# Patient Record
Sex: Male | Born: 1960 | Race: White | Hispanic: No | Marital: Married | Smoking: Current some day smoker
Health system: Southern US, Community
[De-identification: ages and names within clinical notes are randomized; demographics above are authoritative.]

## PROBLEM LIST (undated history)

## (undated) DIAGNOSIS — M1990 Unspecified osteoarthritis, unspecified site: Secondary | ICD-10-CM

## (undated) DIAGNOSIS — K219 Gastro-esophageal reflux disease without esophagitis: Secondary | ICD-10-CM

## (undated) DIAGNOSIS — I10 Essential (primary) hypertension: Secondary | ICD-10-CM

## (undated) DIAGNOSIS — I4891 Unspecified atrial fibrillation: Secondary | ICD-10-CM

## (undated) DIAGNOSIS — E119 Type 2 diabetes mellitus without complications: Secondary | ICD-10-CM

## (undated) DIAGNOSIS — E785 Hyperlipidemia, unspecified: Secondary | ICD-10-CM

## (undated) HISTORY — DX: Hyperlipidemia, unspecified: E78.5

## (undated) HISTORY — DX: Unspecified osteoarthritis, unspecified site: M19.90

## (undated) HISTORY — DX: Unspecified atrial fibrillation: I48.91

## (undated) HISTORY — PX: OTHER SURGICAL HISTORY: SHX169

## (undated) HISTORY — PX: VASECTOMY: SHX75

## (undated) HISTORY — DX: Type 2 diabetes mellitus without complications: E11.9

## (undated) HISTORY — DX: Essential (primary) hypertension: I10

## (undated) HISTORY — PX: COLONOSCOPY: SHX174

## (undated) HISTORY — DX: Gastro-esophageal reflux disease without esophagitis: K21.9

---

## 2007-12-26 ENCOUNTER — Ambulatory Visit: Payer: Self-pay | Admitting: Cardiology

## 2007-12-30 ENCOUNTER — Ambulatory Visit: Payer: Self-pay

## 2007-12-30 ENCOUNTER — Encounter: Payer: Self-pay | Admitting: Cardiology

## 2007-12-30 ENCOUNTER — Ambulatory Visit: Payer: Self-pay | Admitting: Cardiology

## 2008-01-12 ENCOUNTER — Encounter: Admission: RE | Admit: 2008-01-12 | Discharge: 2008-02-14 | Payer: Self-pay | Admitting: Cardiology

## 2008-01-16 ENCOUNTER — Ambulatory Visit: Payer: Self-pay | Admitting: Cardiology

## 2008-02-03 ENCOUNTER — Ambulatory Visit: Payer: Self-pay | Admitting: Internal Medicine

## 2008-07-31 ENCOUNTER — Encounter: Admission: RE | Admit: 2008-07-31 | Discharge: 2008-07-31 | Payer: Self-pay | Admitting: Family Medicine

## 2008-12-21 ENCOUNTER — Telehealth: Payer: Self-pay | Admitting: Cardiology

## 2008-12-25 DIAGNOSIS — I493 Ventricular premature depolarization: Secondary | ICD-10-CM | POA: Insufficient documentation

## 2009-01-02 ENCOUNTER — Ambulatory Visit: Payer: Self-pay | Admitting: Cardiology

## 2009-01-17 ENCOUNTER — Ambulatory Visit: Payer: Self-pay | Admitting: Cardiology

## 2009-01-17 DIAGNOSIS — I959 Hypotension, unspecified: Secondary | ICD-10-CM | POA: Insufficient documentation

## 2009-01-22 ENCOUNTER — Ambulatory Visit: Payer: Self-pay | Admitting: Cardiology

## 2009-01-22 DIAGNOSIS — E785 Hyperlipidemia, unspecified: Secondary | ICD-10-CM

## 2009-01-22 DIAGNOSIS — E1169 Type 2 diabetes mellitus with other specified complication: Secondary | ICD-10-CM | POA: Insufficient documentation

## 2009-01-22 DIAGNOSIS — E7800 Pure hypercholesterolemia, unspecified: Secondary | ICD-10-CM | POA: Insufficient documentation

## 2009-01-22 DIAGNOSIS — I10 Essential (primary) hypertension: Secondary | ICD-10-CM | POA: Insufficient documentation

## 2009-01-22 LAB — CONVERTED CEMR LAB
ALT: 23 units/L (ref 0–53)
AST: 19 units/L (ref 0–37)
Albumin: 4.3 g/dL (ref 3.5–5.2)
Alkaline Phosphatase: 55 units/L (ref 39–117)
Bilirubin, Direct: 0.1 mg/dL (ref 0.0–0.3)
Cholesterol: 150 mg/dL (ref 0–200)
HDL: 25.9 mg/dL — ABNORMAL LOW (ref >39.00)
LDL Cholesterol: 92 mg/dL (ref 0–99)
Total Bilirubin: 1 mg/dL (ref 0.3–1.2)
Total CHOL/HDL Ratio: 6
Total Protein: 7.1 g/dL (ref 6.0–8.3)
Triglycerides: 160 mg/dL — ABNORMAL HIGH (ref 0.0–149.0)
VLDL: 32 mg/dL (ref 0.0–40.0)

## 2009-01-24 LAB — CONVERTED CEMR LAB
BUN: 17 mg/dL (ref 6–23)
CO2: 28 meq/L (ref 19–32)
Calcium: 9.4 mg/dL (ref 8.4–10.5)
Chloride: 105 meq/L (ref 96–112)
Creatinine, Ser: 1.2 mg/dL (ref 0.4–1.5)
GFR calc non Af Amer: 68 mL/min (ref >60)
Glucose, Bld: 109 mg/dL — ABNORMAL HIGH (ref 70–99)
Potassium: 4.2 meq/L (ref 3.5–5.1)
Sodium: 141 meq/L (ref 135–145)

## 2009-01-29 ENCOUNTER — Telehealth (INDEPENDENT_AMBULATORY_CARE_PROVIDER_SITE_OTHER): Payer: Self-pay | Admitting: *Deleted

## 2009-02-25 ENCOUNTER — Telehealth: Payer: Self-pay | Admitting: Cardiology

## 2009-09-25 ENCOUNTER — Telehealth (INDEPENDENT_AMBULATORY_CARE_PROVIDER_SITE_OTHER): Payer: Self-pay | Admitting: *Deleted

## 2009-11-12 ENCOUNTER — Encounter (INDEPENDENT_AMBULATORY_CARE_PROVIDER_SITE_OTHER): Payer: Self-pay | Admitting: *Deleted

## 2010-06-24 NOTE — Progress Notes (Signed)
Summary: WANT TO DISCUSS GET A HOLTER MONITOR   Phone Note Call from Patient Call back at Home Phone [phone]   Caller: Patient Summary of Call: PT WANT TO TALK ABOUT GETTING A HLTER MONITOR AT TIME OF HIS APPT WITH DR.MCLEAN ON AUG 31 Initial call taken by: Judie Grieve,  December 21, 2008 10:35 AM  Follow-up for Phone Call        talked with patient- appt with Dr. 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 Marca Ancona and follow-up with patient Katina Dung, RN, BSN  December 21, 2008 1:42 PM  reviewed with Dr Shirlee Latch OK to order 24 hour monitor -LMVM for pt Katina Dung, RN, BSN  December 21, 2008 5:44 PM      Appended Document: WANT TO DISCUSS GET A HOLTER MONITOR LMVM  Appended Document: WANT TO DISCUSS GET A HOLTER MONITOR talked with patient will order 24 hour monitor    Clinical Lists Changes  Problems: Added new problem of PREMATURE VENTRICULAR CONTRACTIONS (ICD-427.69) Orders: Added new Referral order of Holter Monitor (Holter Monitor) - Signed

## 2010-06-24 NOTE — Progress Notes (Signed)
Summary: FOLLOW UP PAPERWORK   Phone Note Call from Patient Call back at Home Phone [phone]   Caller: Patient Reason for Call: Talk to Nurse Summary of Call: FOLLOW UP PAPERWORK (FAA) NEEDS TO BE RECEIVED BY 02-01-09 Initial call taken by: Burnard Leigh,  January 29, 2009 2:40 PM  Follow-up for Phone Call        needs to know status of paperwork, FAA states that they have not received anything from [place]  Follow-up by: Migdalia Dk,  February 08, 2009 2:18 PM    Spoke to patient concerning urgency of records request. He provided a number to call the Medical Status department concerning the deadline to receive records. [phone].  Records copied and sent to [employer]. For details of address please see attached [employer] shippers receipt. Rene Kocher Flowers  February 14, 2009 10:40 AM

## 2010-06-24 NOTE — Progress Notes (Signed)
Summary: refill  Medications Added NIASPAN 500 MG CR-TABS (NIACIN (ANTIHYPERLIPIDEMIC)) Take 2 tablets at bedtime --take Aspirin 30 minutes before you take Niaspan       Phone Note Refill Request Call back at Home Phone [phone] Message from:  Patient  Refills Requested: Medication #1:  NIASPAN 500 MG CR-TABS 1 a t bedtime for 1 week then 1 at bedtime--take Aspirin 30 minutes before you take Niaspan [facility] [address]...needs refill...recent change in dosage   Method Requested: Electronic Initial call taken by: Burnard Leigh,  February 25, 2009 11:59 AM  Follow-up for Phone Call       Follow-up by: Judithe Modest CMA,  February 25, 2009 12:42 PM    New/Updated Medications: NIASPAN 500 MG CR-TABS (NIACIN (ANTIHYPERLIPIDEMIC)) Take 2 tablets at bedtime --take Aspirin 30 minutes before you take Niaspan Prescriptions: NIASPAN 500 MG CR-TABS (NIACIN (ANTIHYPERLIPIDEMIC)) Take 2 tablets at bedtime --take Aspirin 30 minutes before you take Niaspan  #60 x 6   Entered by:   Judithe Modest CMA   Authorized by:   Marca Ancona, MD   Signed by:   Judithe Modest CMA on 02/25/2009   Method used:   Electronically to        [facility] [phone]* (retail)       [address]       [place], [place]  [address]       Ph: [phone] or [phone]       Fax: [phone]   RxID:   [id number]

## 2010-06-24 NOTE — Assessment & Plan Note (Signed)
Summary: YEARY/NEED LABS FEW DAYS PRIOR TO APPT WITH DR MCLEAN/SL  Medications Added TOPROL XL 25 MG XR24H-TAB (METOPROLOL SUCCINATE) once daily TRICOR 145 MG TABS (FENOFIBRATE) once daily NIASPAN 500 MG CR-TABS (NIACIN (ANTIHYPERLIPIDEMIC)) 1 a t bedtime for 1 week then 1 at bedtime--take Aspirin 30 minutes before you take Niaspan ASPIR-LOW 81 MG TBEC (ASPIRIN) take 30 minutes before you take Niaspan      Allergies Added: NKDA  Primary Provider:  Dr. 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  Marland Kitchen  History of Present Illness: 50 yo with history of metabolic syndrome, strong family history of CAD, and palpitations presents for followup.  Since I last saw him, he has had no significant problems.  Patient's palpitations have been suppressed by Toprol XL.  He had a holter monitor done this month with only occasional PACs and PVCs.  Patient has excellent exercise tolerance and denies exertional dyspnea or chest pain. He walks about 80 minutes a day for exercise.   He is trying to get a pilot's license.    ECG:  NSR at 55, normal  Labs (8/10): LDL 92, HDL 25.9, LFTs normal, creatinine 1.2  Current Medications (verified): 1)  Toprol Xl 25 Mg Xr24h-Tab (Metoprolol Succinate) .... Once Daily 2)  Tricor 145 Mg Tabs (Fenofibrate) .... Once Daily 3)  Niaspan 500 Mg Cr-Tabs (Niacin (Antihyperlipidemic)) .Marland Kitchen.. 1 A T Bedtime For 1 Week Then 1 At Bedtime--Take Aspirin 30 Minutes Before You Take Niaspan 4)  Aspir-Low 81 Mg Tbec (Aspirin) .... Take 30 Minutes Before You Take Niaspan  Allergies (verified): No Known Drug Allergies  Past History:  Past Medical History: 1. Metabolic syndrome with low HDL, impaired fasting glucose, obesity,     high blood pressure, and hypertriglyceridemia. 2. Obesity. 3. Hypertension.  4. Palpitations:  Holter monitor (8/09) showed occasional PVCs only.  Repeat Holter (8/10) showed occasional PACs and PVCs, no significant arrhythmias.  5.  Exercise echo (8/09).  The patient exercised for 9 minutes and reached 10.4 METs.  He did have a hypertensive blood pressure response with his systolic blood pressure rising to 212 mmHg.  There were no EKG changes.  EF increased appropriately with exercise, no stress-induced wall motion abnormalities.  No evidence for ischemia.   Family History: The patient's father had a myocardial infarction in his early 74s.  His grandfather died at the age of 74 with a myocardial infarction, and on his father's side, there are multiple aunts and uncles, who had early-onset coronary artery disease.   Social History: The patient is a Merchandiser, retail in [place].  He does not smoke.  He does not drink alcohol.  He has been trying to exercise up to 6-7 times a week since the last time I saw him.   Review of Systems       All systems reviewed and negative except as noted in HPI.   Vital Signs:  Patient profile:   50 year old male Height:      69 inches Weight:      217 pounds BMI:     32.16 Pulse rate:   61 / minute Pulse rhythm:   regular BP sitting:   130 / 86  (left arm) Cuff size:   regular  Vitals Entered By: Judithe Modest CMA (January 22, 2009 10:45 AM)  Serial Vital Signs/Assessments:  Time      Position  BP       Pulse  Resp  Temp  By 11:12 AM            122/72                         Katina Dung, RN, BSN 11:26 AM            118/70                         Katina Dung, RN, BSN   Physical Exam  General:  Well developed, well nourished, in no acute distress. Neck:  Neck supple, no JVD. No masses, thyromegaly or abnormal cervical nodes. Lungs:  Clear bilaterally to auscultation and percussion. Heart:  Non-displaced PMI, chest non-tender; regular rate and rhythm, S1, S2 without murmurs, rubs or gallops. Carotid upstroke normal, no bruit. Pedals normal pulses. No edema, no varicosities. Abdomen:  Bowel sounds positive; abdomen soft and non-tender without masses, organomegaly, or  hernias noted. No hepatosplenomegaly. Extremities:  No clubbing or cyanosis. Neurologic:  Alert and oriented x 3. Psych:  Normal affect.   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  Palpitations have been effectively suppressed with Toprol XL.  These are likely benign given normal stress echo in 8/09.   Problem # 2:  HYPERTENSION, UNSPECIFIED (ICD-401.9) Good BP control on Toprol XL.   Problem # 3:  HYPERLIPIDEMIA-MIXED (ICD-272.4) Patient is on Tricor.  HDL is low.  He will start Niaspan and titrate up to 1000 mg daily.  I will check a lipoprotein(a) given strong family history of CAD.  If it is elevated, I would treat his LDL to < 70.   Problem # 4:  FAMILY HISTORY OF CAD Strong family history of CAD.  I have asked the patient to start ASA 81 mg daily.   Followup in 1 year.    Other Orders: EKG w/ Interpretation (93000) T-Lipoprotein (a) (04540-98119) TLB-BMP (Basic Metabolic Panel-BMET) (80048-METABOL)  Patient Instructions: 1)  Your physician recommends that you return for lab work today--BMP/Lpa  272.0  2)  Your physician has recommended you make the following change in your medication:  3)  Start Niaspan 500mg  at bedtime for 1 week then increase Niaspan to 500mg  (2) at bedtime----take Aspirin 81mg  30 minutes before you take Niaspan 4)  Your physician recommends that you schedule a follow-up appointment in: 1 year with Dr. Marca Ancona  Prescriptions: NIASPAN 500 MG CR-TABS (NIACIN (ANTIHYPERLIPIDEMIC)) 1 a t bedtime for 1 week then 1 at bedtime--take Aspirin 30 minutes before you take Niaspan  #60 x 6   Entered by:   Katina Dung, RN, BSN   Authorized by:   Marca Ancona, MD   Signed by:   Katina Dung, RN, BSN on 01/22/2009   Method used:   Electronically to        [facility] [phone] (retail)       [address]       [place], [place]  [address]       Ph: [phone] or [phone]       Fax:  [phone]   RxID:   [id number]     Vital Signs:  Patient profile:   50 year old male Height:      69 inches Weight:      217 pounds BMI:     32.16 Pulse rate:   61 / minute Pulse rhythm:   regular BP sitting:   130 / 86  (  left arm) Cuff size:   regular  Vitals Entered By: Judithe Modest CMA (January 22, 2009 10:45 AM)

## 2010-06-24 NOTE — Letter (Signed)
Summary: Appointment - Reminder 2  [facility], Main Office  [address]   [place], [place] [address]   Phone: [phone]  Fax: [phone]     November 12, 2009 MRN: [id number]   Benjamin Wright [address] [place], [place]  [address]   Dear Benjamin Wright,  Our records indicate that it is time to schedule a follow-up appointment with Dr. Shirlee Latch in August. It is very important that we reach you to schedule this appointment. We look forward to participating in your health care needs. Please contact us at the number listed above at your earliest convenience to schedule your appointment.  If you are unable to make an appointment at this time, give [place] a call so we can update our records.     Sincerely,   Migdalia Dk [facility] Scheduling Team

## 2010-06-24 NOTE — Progress Notes (Signed)
   Request recieved from [facility] wanting Records sent to [facility]  Sep 25, 2009 3:38 PM    Appended Document:  Recieved 2nd request from [facility] sent to [facility]

## 2010-09-20 ENCOUNTER — Other Ambulatory Visit: Payer: Self-pay | Admitting: Cardiology

## 2010-09-20 DIAGNOSIS — I493 Ventricular premature depolarization: Secondary | ICD-10-CM

## 2010-09-25 ENCOUNTER — Other Ambulatory Visit: Payer: Self-pay | Admitting: Cardiology

## 2010-10-07 NOTE — Assessment & Plan Note (Signed)
HEALTHCARE                            CARDIOLOGY OFFICE NOTE   NAME:Benjamin Wright, Benjamin Wright                      MRN:          [id number]  DATE:01/16/2008                            DOB:          Apr 18, 1961    PRIMARY CARE PHYSICIAN:  Talmadge Coventry, MD   HISTORY OF PRESENT ILLNESS:  This is a 50 year old with history of  metabolic syndrome, borderline hypertension, and strong family history  of coronary artery disease, who comes back to Cardiology Clinic for  evaluation.  The patient did have a stress echo in the interim since his  last appointment, which is showing quite good exercise tolerance with  the patient with 10.4 METs.  There is no evidence for stress-induced  ischemia.  He had a relatively normal-looking echocardiogram.  One thing  we did note was that he did have a significantly hypertensive response  to exercise with systolic pressure going up to 914 with exercise.  We  also did a Holter monitor that showed as we had suspected premature  ventricular contractions.  The patient has been doing well since his  last visit.  He saw Nutrition, who has changed his diet and and he  increased his exercise level, and he has actually lost 5 pounds since  the last time I saw him.  He continues to have occasional episodes of  palpitations, which are mildly distressing.  No chest pain, no shortness  of breath with exertion, and no syncopal episodes.   PAST MEDICAL HISTORY:  1. Metabolic syndrome with low HDL, impaired fasting glucose, obesity,      high blood pressure, and hypertriglyceridemia.  2. Obesity.  3. Hypertension.   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  He does not drink alcohol.  He has been trying to  exercise up to 6-7 times a week since the last time I saw him.   FAMILY HISTORY:  The patient's father had a myocardial infarction in his  early 84s.  His grandfather died at the age of  60 with a myocardial  infarction, and on his father's side, there are multiple aunts and  uncles, who had early-onset coronary artery disease.   MEDICATIONS:  1. Fish oil 3600 mg daily.  2. Aspirin 81 mg daily.  3. Multivitamin.   PHYSICAL EXAMINATION:  VITAL SIGNS:  Blood pressure 138/82, weight is  218 pounds that is down from 223 pounds at last appointment, and heart  rate is 74 and regular.  GENERAL:  In no apparent distress, mildly obese male.  NECK:  No JVD.  No thyromegaly or nodule.  LUNGS:  Clear to auscultation bilaterally with normal respiratory  effort.  HEART:  Regular S1 and S2.  There is an S4 present.  There is no murmur.  There is no pedal edema.  There are 2+ posterior tibial pulses  bilaterally.  There is no carotid bruit.  ABDOMEN:  Soft, nontender, and obese.  No hepatosplenomegaly.  EXTREMITIES:  No clubbing, cyanosis, or edema.   STUDIES:  1. Holter monitor.  This showed occasional  PVCs only.  2. Exercise echo.  The patient exercised for 9 minutes and reached      10.4 METs.  He did have a hypertensive blood pressure response with      his systolic blood pressure rising to 212 mmHg.  There were no EKG      changes.  3. On echocardiogram, the baseline echo showed an EF of 35%, no wall      motion abnormalities, mild mitral regurgitation, and normal      diastolic function.  EF rose to 70% with exercise, and there were      no stress-induced wall motion abnormalities.  This study does not      indicate the presence of cardiac ischemia.   ASSESSMENT AND PLAN:  This is a 50 year old with history of metabolic  syndrome, palpitations, and borderline hypertension, who presents to  Cardiology Clinic for evaluation.  1. Palpitations.  These appear to be due to occasional premature      ventricular contractions.  He did have a normal echocardiogram.  He      has been backing off on caffeine.  Given his borderline blood      pressure elevation at rest and his  significant exertional      hypertension, we will start him on Toprol-XL 25 mg daily with dual      goal of attempting to suppress some of the premature ventricular      contractions and also to decrease his blood pressure.  We will      bring him back in 2-3 weeks for a blood pressure check by the      nurses.  2. Metabolic syndrome.  The patient does have hypertriglyceridemia,      low HDL, obesity, and impaired fasting glucose.  He does need to      lose weight.  He has been working on this.  He is exercising while      dieting.  He did see the nutritionist at [facility].  He has      increased his fish oil to 3600 mg daily.  For now, I would hold off      on starting niacin, as the patient has impaired fasting glucose,      the niacin could cause this to further develop into frank diabetes.      I will see him back in 1 year and will check a fasting lipid panel      prior to him coming in.  3. Coronary artery disease risk.  The patient has metabolic syndrome,      and he has borderline hypertension.  He also has a very strong      family history for early coronary artery disease.  His stress echo      did look normal, which is reassuring.  For now, we will work on      risk factor modification including diet, weight loss, use of fish      oil, and Toprol-XL for blood pressure control.     Marca Ancona, MD  Electronically Signed    DM/MedQ  DD: 01/16/2008  DT: 01/17/2008  Job #: [id number]   cc:   Talmadge Coventry, M.D.  Madolyn Frieze Jens Som, MD, [facility]

## 2010-10-07 NOTE — Assessment & Plan Note (Signed)
[facility]                            CARDIOLOGY OFFICE NOTE   NAME:Benjamin Wright, Benjamin Wright                      MRN:          [id number]  DATE:12/26/2007                            DOB:          [birth year]    PRIMARY CARE PHYSICIAN:  Talmadge Coventry, MD   HISTORY OF PRESENT ILLNESS:  This is a 50 year old with a history of  metabolic syndrome who presents to cardiology clinic for evaluation of  palpitations.  The patient states he has had several months of  palpitations, these manifest as pauses followed by strong beats.  Pauses  are very short and the strong beats following the short pauses are  causing him to be uncomfortable.  This happens daily, and as mentioned,  has been going on for several months.  He gets no chest pain or  discomfort.  He has no dyspnea on exertion.  No episodes of syncope or  presyncope.  He does have normal exercise tolerance.  Of note, he does  drink 2 cups of coffee a day and has been doing so for long time.   EKG today, normal sinus rhythm.  No ST-T wave changes.  No Q waves.   PAST MEDICAL HISTORY:  1. Metabolic syndrome with low HDL, impaired fasting glucose, and      hypertriglyceridemia.  2. Obesity.   SOCIAL HISTORY:  The patient is a Merchandiser, retail at [employer].  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  He does not drink alcohol.  He does state that he walks  about 1-1/2 miles 3 times a week for exercise.   FAMILY HISTORY:  The patient's father had a myocardial infarction in his  early 47s.  His grandfather died at the age 2 with myocardial  infarction, and on his father's side, there are multiple aunts and  uncles who had early onset coronary artery disease.   LABORATORY DATA:  Most recent labs in May 2009, hematocrit 49.6,  creatinine 1.1, glucose 122, hemoglobin A1c 6.1, HDL 27, LDL 85, and  triglycerides 241.   REVIEW OF SYSTEMS:  Negative except as noted in the history of present  illness.   MEDICATIONS:  The  patient takes fish oil daily, this he started about 2  weeks ago and multivitamin.   PHYSICAL EXAMINATION:  VITAL SIGNS:  Blood pressure is 146/94, heart  rate 60 and regular, no premature beats, and weight 223.  GENERAL:  No apparent distress.  Mildly obese male.  NEUROLOGIC:  Alert and oriented x4.  Normal affect.  NECK:  No JVD.  LUNGS:  Clear to auscultation bilaterally.  HEART:  Regular S1 and S2.  There is an S4 present.  There is no murmur.  There is no pedal edema.  There are 2+ posterior tibial pulses  bilaterally.  No carotid bruit.  ABDOMEN:  Soft, obese, nontender, and no hepatosplenomegaly.  EXTREMITIES:  There is no clubbing, cyanosis, or edema.  SKIN: Normal.  MSK: Normal.  HEENT: Normal.   ASSESSMENT AND PLAN:  This is a 50 year old with a history of metabolic  syndrome and palpitations who presents to cardiology clinic for  evaluation.  1. Palpitations.  His description of palpitations may be sound like      premature beats.  He has been getting them for about 3 months or      so.  He has had no episodes of syncope or presyncope.  We will set      him up for a Holter monitor and I have encouraged him to stop use      of caffeine to see if this improves his symptoms.  If his Holter      does confirm premature ventricular contractions or premature atrial      contractions and he continues to have uncomfortable symptoms off      caffeine, we will consider starting a low dose of beta-blocker like      Toprol-XL.  2. Metabolic syndrome.  The patient does have hypertriglyceridemia,      low HDL, obesity, and impaired fasting glucose.  He does need to      lose weight.  I talked him about this in a length and we will refer      him to the nutritionist at [facility].  Additionally, his      triglycerides are high at 241.  He has recently started on fish oil      and we will see if that helps control that level.  His LDL is      excellent, and unfortunately, his HDL is  low, I have encouraged him      to increase his exercise up to 6 to 7 times a week instead of 3      times a week.  I would hold off for now on starting something like      niacin on him, since especially in someone with impaired fasting      glucose, niacin could flip them over into frank diabetes.  3. Coronary artery disease risk.  The patient does have metabolic      syndrome and his blood pressure is elevated today.  Additionally,      he has a very strong family history of early coronary disease.  As      far as the workup for his palpitations, I would like to obtain an      echocardiogram, and given his very strong family history of      coronary artery disease, we will go ahead and make this an exercise      echocardiogram, so we will get a full echocardiogram to determine      he has normal heart function and also add a stress component to      this to assess for ischemia.  4. Blood pressure.  The patient's blood pressure is elevated today at      146/94.  Going through his records and talking to him, he has never      been hypertensive when seen by his primary care physician.  When we      bring him back for his stress echo, we will repeat his blood      pressure, and if his blood pressure is elevated at that time, we      will start him on hydrochlorothiazide 25 mg daily.     Marca Ancona, MD  Electronically Signed    DM/MedQ  DD: 12/26/2007  DT: 12/27/2007  Job #: [id number]   cc:   Talmadge Coventry, M.D.  Rollene Rotunda, MD, [facility]

## 2010-11-15 ENCOUNTER — Other Ambulatory Visit: Payer: Self-pay | Admitting: Cardiology

## 2010-12-31 ENCOUNTER — Other Ambulatory Visit: Payer: Self-pay | Admitting: *Deleted

## 2010-12-31 MED ORDER — METOPROLOL SUCCINATE ER 25 MG PO TB24: 25.0000 mg | ORAL_TABLET | Freq: Every day | ORAL | Status: DC

## 2011-02-08 ENCOUNTER — Other Ambulatory Visit: Payer: Self-pay | Admitting: Cardiology

## 2011-02-10 NOTE — Telephone Encounter (Signed)
Dr Shirlee Latch has not seen pt since 01/22/09. I do not think Dr Shirlee Latch prescribed this for him. Cannot refill.

## 2011-03-24 ENCOUNTER — Inpatient Hospital Stay (HOSPITAL_COMMUNITY)
Admission: EM | Admit: 2011-03-24 | Discharge: 2011-03-27 | DRG: 229 | Disposition: A | Discharge status: Home / Self Care | Payer: Federal, State, Local not specified - PPO | Attending: Orthopedic Surgery | Admitting: Orthopedic Surgery

## 2011-03-24 ENCOUNTER — Emergency Department (HOSPITAL_COMMUNITY): Payer: Federal, State, Local not specified - PPO

## 2011-03-24 DIAGNOSIS — W260XXA Contact with knife, initial encounter: Secondary | ICD-10-CM | POA: Diagnosis present

## 2011-03-24 DIAGNOSIS — T148XXA Other injury of unspecified body region, initial encounter: Secondary | ICD-10-CM | POA: Diagnosis present

## 2011-03-24 DIAGNOSIS — Y93D9 Activity, other involving arts and handcrafts: Secondary | ICD-10-CM

## 2011-03-24 DIAGNOSIS — Y92009 Unspecified place in unspecified non-institutional (private) residence as the place of occurrence of the external cause: Secondary | ICD-10-CM

## 2011-03-24 DIAGNOSIS — S61209A Unspecified open wound of unspecified finger without damage to nail, initial encounter: Principal | ICD-10-CM | POA: Diagnosis present

## 2011-03-24 DIAGNOSIS — I10 Essential (primary) hypertension: Secondary | ICD-10-CM | POA: Diagnosis present

## 2011-03-24 LAB — BASIC METABOLIC PANEL
BUN: 19 mg/dL (ref 6–23)
CO2: 24 mEq/L (ref 19–32)
Calcium: 9.2 mg/dL (ref 8.4–10.5)
Chloride: 102 mEq/L (ref 96–112)
Creatinine, Ser: 1.01 mg/dL (ref 0.50–1.35)
GFR calc Af Amer: >90 mL/min (ref >90)
GFR calc non Af Amer: 84 mL/min — ABNORMAL LOW (ref >90)
Glucose, Bld: 225 mg/dL — ABNORMAL HIGH (ref 70–99)
Potassium: 4.4 mEq/L (ref 3.5–5.1)
Sodium: 136 mEq/L (ref 135–145)

## 2011-03-24 LAB — DIFFERENTIAL
Basophils Absolute: 0 10*3/uL (ref 0.0–0.1)
Basophils Relative: 0 % (ref 0–1)
Eosinophils Absolute: 0.2 10*3/uL (ref 0.0–0.7)
Eosinophils Relative: 2 % (ref 0–5)
Lymphocytes Relative: 10 % — ABNORMAL LOW (ref 12–46)
Lymphs Abs: 1.2 10*3/uL (ref 0.7–4.0)
Monocytes Absolute: 0.8 10*3/uL (ref 0.1–1.0)
Monocytes Relative: 7 % (ref 3–12)
Neutro Abs: 9 10*3/uL — ABNORMAL HIGH (ref 1.7–7.7)
Neutrophils Relative %: 81 % — ABNORMAL HIGH (ref 43–77)

## 2011-03-24 LAB — CBC
HCT: 46.2 % (ref 39.0–52.0)
Hemoglobin: 16.8 g/dL (ref 13.0–17.0)
MCH: 31.6 pg (ref 26.0–34.0)
MCHC: 36.4 g/dL — ABNORMAL HIGH (ref 30.0–36.0)
MCV: 87 fL (ref 78.0–100.0)
Platelets: 161 10*3/uL (ref 150–400)
RBC: 5.31 MIL/uL (ref 4.22–5.81)
RDW: 13 % (ref 11.5–15.5)
WBC: 11.2 10*3/uL — ABNORMAL HIGH (ref 4.0–10.5)

## 2011-03-27 MED ORDER — LACTATED RINGERS IV SOLN: INTRAVENOUS | Status: DC

## 2011-03-27 MED ORDER — OXYCODONE-ACETAMINOPHEN 5-325 MG PO TABS: 1.0000 | ORAL_TABLET | ORAL | Status: DC | PRN

## 2011-03-27 MED ORDER — SENNOSIDES-DOCUSATE SODIUM 8.6-50 MG PO TABS
1.0000 | ORAL_TABLET | Freq: Two times a day (BID) | ORAL | Status: DC
  Filled 2011-03-27 (×5): qty 1

## 2011-03-27 MED ORDER — VITAMIN C 500 MG PO TABS
1000.0000 mg | ORAL_TABLET | Freq: Every day | ORAL | Status: DC
  Filled 2011-03-27 (×3): qty 2

## 2011-03-27 MED ORDER — ASPIRIN EC 325 MG PO TBEC
325.0000 mg | DELAYED_RELEASE_TABLET | Freq: Every day | ORAL | Status: DC
  Filled 2011-03-27 (×3): qty 1

## 2011-03-27 MED ORDER — ONDANSETRON HCL 4 MG/2ML IJ SOLN: 4.0000 mg | INTRAMUSCULAR | Status: DC | PRN

## 2011-03-27 MED ORDER — METHOCARBAMOL 100 MG/ML IJ SOLN
500.0000 mg | Freq: Four times a day (QID) | INTRAVENOUS | Status: DC | PRN
  Filled 2011-03-27: qty 5

## 2011-03-27 MED ORDER — METHOCARBAMOL 500 MG PO TABS: 500.0000 mg | ORAL_TABLET | Freq: Four times a day (QID) | ORAL | Status: DC | PRN

## 2011-03-27 MED ORDER — CEFAZOLIN SODIUM 1-5 GM-% IV SOLN
1.0000 g | Freq: Three times a day (TID) | INTRAVENOUS | Status: DC
  Filled 2011-03-27 (×7): qty 50

## 2011-03-27 MED ORDER — ALPRAZOLAM 0.5 MG PO TABS: 0.5000 mg | ORAL_TABLET | Freq: Four times a day (QID) | ORAL | Status: DC | PRN

## 2011-03-27 MED ORDER — BIOTENE DRY MOUTH MT LIQD
15.0000 mL | Freq: Two times a day (BID) | OROMUCOSAL | Status: DC
  Filled 2011-03-27 (×5): qty 15

## 2011-03-27 MED ORDER — MORPHINE SULFATE 2 MG/ML IJ SOLN: 1.0000 mg | INTRAMUSCULAR | Status: DC | PRN

## 2011-03-27 NOTE — Op Note (Signed)
Benjamin Wright, Benjamin Wright NO.:  [id number]  MEDICAL RECORD NO.:  [id number]  LOCATION:  1517                         FACILITY:  [facility]  PHYSICIAN:  Dionne Ano. Ameshia Pewitt, M.D.DATE OF BIRTH:  [birth year]  DATE OF PROCEDURE: DATE OF DISCHARGE:                              OPERATIVE REPORT   PREOPERATIVE DIAGNOSES: 1. Status post laceration to the right small finger with neurovascular     and tendon injury. 2. Laceration to the right ring finger with skin and subcutaneous     tissue involvement.  POSTOPERATIVE DIAGNOSIS: 1. Status post laceration to the right small finger with neurovascular     and tendon injury. 2. Laceration to the right ring finger with skin and subcutaneous     tissue involvement.  PROCEDURE: 1. Irrigation and debridement of skin and subcutaneous tissue, right     ring finger.     a.     Exploration and extensive in nature right ring finger      including neurovascular bundles which were tagged.     b.     Complex closure, right ring finger with Prolene suture.     c.     Irrigation and debridement of skin, subcutaneous tissue,      tendon, muscle, and periosteal tissue, right small finger.  This      was excisional debridement. 2. Repair of FDS tendon (flexor digitorum superficialis tendon, zone     2, right small finger). 3. Zone 2 flexor digitorum profundus repair right small finger. 4. Ulnar digital nerve repair right small finger. 5. Radial digital nerve repair right small finger in zone 2. 6. Radial digital artery repair reconstruction, right small finger. 7. Microscope use for the above.  SURGEON:  Dionne Ano. Amanda Pea, M.D.  ASSISTANT:  None.  COMPLICATION:  None.  ANESTHESIA:  General.  TOURNIQUET TIME:  Less than an hour.  INDICATIONS FOR THE PROCEDURE:  This patient is a 50 year old male presents with afore-mentioned diagnosis.  I have counseled him in regard to risks and benefits of the surgery including risk of  infection, bleeding, anesthesia, damage to normal structures, and failure of surgery to accomplish its intended goals of relieving symptoms and restoring function.  With this in mind, he desires to proceed.  All questions have been encouraged and answered preoperatively.  I should note that the patient has severe injury, seen by ER staff. Given the dysvascular nature of his hand, I was called to see him.  He has obvious neurovascular injury as well as tendon injury to the small finger.  I have discussed with him possible scenarios of loss of finger, poor blood flow, cold intolerance, arthrofibrosis, tendon adhesions, etc.  With this in mind, he desired to proceed.  He understands the risk of dystrophy, infection, bleeding, and anesthesia.  OPERATIVE PROCEDURE:  He was seen by myself and anesthesia.  Arm was marked.  A time-out called.  Preop checklist completed. He underwent a general anesthetic.  He was counseled extensively by myself preoperatively as well as general anesthesia counseled.  Once in the operative arena, his finger was quite dysvascular.  The right small finger and ring finger had good refill.  He was prepped and  draped in usual sterile fashion, Betadine scrub paint.  Once this was done, I opened the ring finger, I performed an I and D of skin, subcutaneous tissue, excisional nature with combination of scissor orthopedic instruments and curette.  Following this, I explored the ring finger extensively.  I identified the nerves and vascular bundles, and noted these were intact.  Following this, I performed a complex closure with Prolene.  Thus, I and D, exploration extensive in nature and wound closure was accomplished about the ring finger.  Tendons were intact.  Following this, the small finger was identified and underwent I and D of skin, subcutaneous tissue, periosteal tissue, muscle tendon, and deep structures.  Excisional debridement was accomplished with  curette, orthopedic scissor, and a knife blade.  Following this, I then identified the FDS and FDP tendons, which were completely lacerated.  I also identified the ulnar digital nerve and ulnar digital artery, as well as radial digital nerve and radial digital artery which were all injured and lacerated.  The ulnar digital artery was in very poor repair and very remnant in nature.  The radial digital artery was acceptable for reconstruction purposes. At this time, he underwent a 4-strand modified Kessler-Tajima repair the FDS tendon with an epitendinous stitch of 6-0 nylon.  Following this, he underwent FDS (flexor digitorum superficialis) tendon repair with 4-0 FiberWire.  As the FDP and FDS tendons were repaired in zone 2, these were 2 separate tendon repairs in no man's land (zone 2).  Once this was done, the ulnar digital nerve was evaluated and underwent repair with the microscope and the microscope was brought in, ulnar digital nerve was repaired.  The ulnar digital artery was incapable of any the repair, was quite poor in its condition.  At this time, I moved on to the radial digital artery, which was my biggest concern.  With microscopic technique.  I trimmed edges, placed lidocaine to dilate the vessels and then established proximal flow. Following this, an Acland clamp was placed and a circumferential repair with 10-0 nylon was accomplished under the microscope.  Once this was done, the Acland clamp was removed and the patient had a stovepipe repair.  It looked excellent.  There were no complicating features and the finger pinked up beautifully.  I was very pleased to see this as I was quite concerned about the viability of his finger.  With the vascular repair performed and good refill noted, I then allowed time to observe this to make sure no problems recur.  Once this was noted, I then performed a radial digital nerve repair.  Thus radial digital nerve and ulnar  digital nerve repair was performed with an epineurial technique utilizing 8-0 nylon and the microscope. The radial digital artery which was the most important repair for viability of the finger was accomplished under the microscope as well with 10-0 nylon.  Following this, wound irrigation was applied, followed by closure of the wound with 5-0 Prolene.  He was dressed sterilely with Adaptic, Xeroform, fluff gauze, and a splint was then applied. He will be admitted for full neurovascular precautions.  I am going to place him on dextran LMD 40 as well as aspirin.  No caffeine or chocolate, and __________ precautions.  We will monitor him closely while he was in the hospital.  I will plan for Ancef for antibiotic prophylaxis and pain management according to his needs. These notes were discussed and all questions have encouraged and answered.     Dionne Ano.  Amanda Pea, M.D.     [facility]  D:  03/25/2011  T:  03/25/2011  Job:  [id number]  Electronically Signed by Dominica Severin M.D. on 03/27/2011 06:20:47 PM

## 2011-04-09 NOTE — H&P (Signed)
NAMEJEROLD, Benjamin Wright NO.:  [id number]  MEDICAL RECORD NO.:  [id number]  LOCATION:  1517                         FACILITY:  [facility]  PHYSICIAN:  Dionne Ano. Gramig, M.D.DATE OF BIRTH:  May 30, 1960  DATE OF ADMISSION:  03/24/2011 DATE OF DISCHARGE:  03/27/2011                             HISTORY & PHYSICAL   CHIEF COMPLAINT:  "I cut my hand."  HISTORY OF PRESENT ILLNESS:  Benjamin Wright is a very pleasant 50 year old gentleman who presented to the [facility] Emergency Room after a laceration he sustained to his right hand.  The patient was carving a Jack-o-lantern and unfortunately slipped while cutting the pumpkin and lacerated his right hand.  He had significant bleeding and wrapped the hand and presented to the [facility] Emergency Room where hand and upper extremity surgeon, Dr. Dominica Severin was contacted for further evaluation.  The patient was noted to have a laceration primary to the fourth and fifth fingers with notable poor circulation to the small finger and obvious tendinous injury.  Given the vascular compromise and tendon injury, a decision was made to proceed to the surgical arena urgently.  All risks and benefits were discussed with the patient to include loss of digit and amputation.  Preoperative laboratory data showed his hemogram was stable.  Routine chemistry showed that he had noted hyperglycemia at 225, otherwise no abnormalities present.  PAST MEDICAL HISTORY:  He denied any health history.  PAST SURGICAL HISTORY:  Tonsillectomy, adenoidectomy, and vasectomy.  DRUG ALLERGIES:  No known drug allergies.  MEDICATIONS:  Baby aspirin daily.  SOCIAL HISTORY:  Denies tobacco or alcohol use.  He works as an Librarian, academic at [employer].  PHYSICAL EXAMINATION:  GENERAL:  Very pleasant gentleman in no acute distress.  Appearing his stated age.  Alert and oriented x3.  Well groomed. HEAD:  Atraumatic and normocephalic. LUNGS:  Clear to  auscultation bilaterally. HEART:  Regular rate and rhythm. ABDOMEN:  Soft and nontender. EXTREMITIES:  Evaluation of the bilateral upper extremities to include the right upper extremity showed he had a volar laceration to the small and ring finger with poor circulation present.  Sensation was diminished.  Isolated FDP and FDS.  Exercises showed these were nonfunctioning to the small finger.  X-ray showed no acute bony abnormalities.  Laboratory and radiographs as previously mentioned in the history.  PLAN:  We had a lengthy discussion with [name] given the nature of his upper extremity predicament as well as need for surgical intervention.  We discussed with him risks to include bleeding, infection, anesthesia, damage to normal structures, failure of surgery which could accomplish goals of malignant symptoms or restoring function as well as possible loss of digit.  With this in mind, he desired to proceed.  He will be admitted and undergo surgical endeavors.  All questions were encouraged and answered.     Karie Chimera, P.A.-C.   ______________________________ Dionne Ano. Amanda Pea, M.D.    BB/MEDQ  D:  04/09/2011  T:  04/09/2011  Job:  [id number]

## 2011-04-09 NOTE — Discharge Summary (Signed)
[name], [name] NO.:  [id number]  MEDICAL RECORD NO.:  [id number]  LOCATION:  1517                         FACILITY:  [facility]  PHYSICIAN:  Karie Chimera, P.A.-C.DATE OF BIRTH:  November 05, 1960  DATE OF ADMISSION:  03/24/2011 DATE OF DISCHARGE:  03/27/2011                              DISCHARGE SUMMARY   SURGEON:  Dionne Ano. Amanda Pea, M.D.  ADMITTING DIAGNOSIS:  Laceration to the right small and ring fingers with neurovascular compromise present about the small finger as well as loss of tendinous function.  PROCEDURE:  Incision and drainage of the right ring and small finger to include flexor digitorum superficialis zone 2 tendon repair to the right small finger, ulnar digital nerve repair, radial digital nerve repair, and radial digital artery repair to the right small finger.  CONSULTS:  None.  BRIEF HISTORY OF PRESENT ILLNESS:  Mr. Benjamin Wright is a very pleasant gentleman who unfortunately sustained a significant laceration to his hand while carving a pumpkin.  He was noted to have neurovascular compromise as well as loss of tendinous function.  Decision made to proceed to the operative suite emergently for repair and reconstruction and revision procedure as necessary.  All risks and benefits were discussed with the patient prior to proceeding to the operative suite.  HOSPITAL COURSE:  The patient was admitted on March 24, 2011, and underwent the above procedure without complicating features.  Please see the operative report for full details.  He was admitted to the unit for standard pain control measure to include p.o. and IV pain medications; prophylactic antibiotics in the form of Ancef as well as a diligent vascular care, keeping the room above 75 degrees; avoiding caffeine, nicotine, and chocolate.  The patient did very well in regards to his pain control.  He did not have significant difficulties as pain was controlled throughout this process.  Postoperative day #1, he was doing quite well.  He denied fevers or chills.  He was tolerating p.o., some voiding without difficulty.  His vital signs were stable.  He had excellent refill about the tip of the finger.  No signs of infection was present.  Heart was regular rate and rhythm.  Abdomen was nontender. Dextran and aspirin were implemented for anticoagulant purposes.  The following day, he was doing quite well without complaints.  Tolerating p.o. as well, voiding without difficulties, and had had a bowel movement without difficulties.  His vital signs stable, afebrile.  Head was atraumatic.  Chest; he had equal expansion present, but no rhonchi or wheezing present.  Abdomen was nontender.  Examination of his right upper extremity showed __________ continued excellent refill to the tip of the finger with obvious diminished sensation as expected.  The following day, he was doing quite well and was in excellent spirits.  He was very eager for discharge.  His pain was controlled.  He denied nausea, vomiting, fevers, chills, shortness of breath, or chest pain. His vital signs were stable; blood pressure 149/74, pulse 72, respirations 16, temperature 98.5, his O2 sats were 94%-97%.  Head was atraumatic, normocephalic.  Chest was equal expansion present with no wheezing or rhonchi.  Right upper extremity shows splint was clean, dry, intact; refill  was brisk; looked excellent diminished sensation.  Once again I suspect, no signs of infection are present.  ASSESSMENT/FINAL DIAGNOSIS:  Status post lacerating injury to the right ring and small finger, requiring nerve, artery, and tendinous repair to the small finger.  PLAN:  Decision made to discharge the patient home in stable condition. We discussed with him at length vascularity precautions as implemented into the hospital.  He will continue aspirin 325 twice daily; keeping the hand warm, clean, and dry; avoiding caffeine, chocolate,  and nicotine.  Otherwise, he will be on a regular diet.  His condition overall was improved.  His discharge medications were include Percocet 5/325 one to two p.o. q.4-6 hours p.r.n. pain, #40; Robaxin 500 one p.o. q.6-8 p.r.n. spasm, vitamin C 1000 mg daily.  He will follow up with [place] at our office next Thursday at 8 a.m. in therapy immediately following. He will call [phone] for any questions or concerns.  We discussed with him his initial laboratory results, any increased glucose well at that time, we will recommend outpatient followup __________.  All questions were encouraged and answered.     Karie Chimera, P.A.-C.     [facility]  D:  04/09/2011  T:  04/09/2011  Job:  [id number]

## 2015-10-23 DIAGNOSIS — F909 Attention-deficit hyperactivity disorder, unspecified type: Secondary | ICD-10-CM | POA: Insufficient documentation

## 2017-08-03 DIAGNOSIS — M25579 Pain in unspecified ankle and joints of unspecified foot: Secondary | ICD-10-CM | POA: Insufficient documentation

## 2017-12-20 DIAGNOSIS — M949 Disorder of cartilage, unspecified: Secondary | ICD-10-CM

## 2017-12-20 DIAGNOSIS — M899 Disorder of bone, unspecified: Secondary | ICD-10-CM | POA: Insufficient documentation

## 2018-05-27 ENCOUNTER — Telehealth: Payer: Self-pay | Admitting: General Practice

## 2018-05-27 NOTE — Telephone Encounter (Signed)
Left voicemail for patient to call us to scheduele this appointment.

## 2018-05-27 NOTE — Telephone Encounter (Signed)
Wife, Benjamin Wright.O.B-[birth year], an established patient with Dr.Metheney, calling in wanting to know if you would see her husband Benjamin Wright as a new patient. Please advise.

## 2018-05-27 NOTE — Telephone Encounter (Signed)
I would be happy to see him. Ok to schedule.

## 2018-06-29 ENCOUNTER — Ambulatory Visit (INDEPENDENT_AMBULATORY_CARE_PROVIDER_SITE_OTHER): Payer: Federal, State, Local not specified - PPO | Admitting: Family Medicine

## 2018-06-29 ENCOUNTER — Encounter: Payer: Self-pay | Admitting: Family Medicine

## 2018-06-29 VITALS — BP 126/78 | HR 83 | Temp 98.7°F | Ht 69.0 in | Wt 214.0 lb

## 2018-06-29 DIAGNOSIS — E118 Type 2 diabetes mellitus with unspecified complications: Secondary | ICD-10-CM | POA: Diagnosis not present

## 2018-06-29 DIAGNOSIS — I493 Ventricular premature depolarization: Secondary | ICD-10-CM

## 2018-06-29 DIAGNOSIS — Z1211 Encounter for screening for malignant neoplasm of colon: Secondary | ICD-10-CM | POA: Diagnosis not present

## 2018-06-29 DIAGNOSIS — E7800 Pure hypercholesterolemia, unspecified: Secondary | ICD-10-CM

## 2018-06-29 DIAGNOSIS — Z23 Encounter for immunization: Secondary | ICD-10-CM

## 2018-06-29 LAB — POCT UA - MICROALBUMIN
Creatinine, POC: 300 mg/dL
Microalbumin Ur, POC: 80 mg/L

## 2018-06-29 LAB — POCT GLYCOSYLATED HEMOGLOBIN (HGB A1C): Hemoglobin A1C: 7.6 % — AB (ref 4.0–5.6)

## 2018-06-29 NOTE — Assessment & Plan Note (Signed)
Rarely symptomatic.

## 2018-06-29 NOTE — Progress Notes (Signed)
New Patient Office Visit  Subjective:  Patient ID: Benjamin Wright, male    DOB: 1960-08-28  Age: 58 y.o. MRN: [id number]  CC:  Chief Complaint  Patient presents with  . Establish Care    HPI Benjamin Wright presents to establish care.  He has a history of HTN and hyperlipidemia,  He also was diagnosed with diabetes about 7 to 8 years ago.  He is actually been able to control it with diet exercise and metformin.  The about 4 months ago they did increase his metformin to thousand milligrams twice a day.  He also had left ankle surgery and so this has left him without being able to exercise and he has gained some weight since then.  Diabetes - no hypoglycemic events. No wounds or sores that are not healing well. No increased thirst or urination. [name]. Taking medications as prescribed without any side effects.  Hyperlipidemia - tolerating statin well.   He is due for colon ca screening in April.      Past Medical History:  Diagnosis Date  . Atrial fibrillation ([place])   . Diabetes mellitus without complication ([place])   . Hyperlipidemia   . Hypertension     Past Surgical History:  Procedure Laterality Date  . cyst removal from ankle    . finger reattachment    . VASECTOMY      Family History  Problem Relation Age of Onset  . Diabetes Mother   . Heart attack Father     Social History   Socioeconomic History  . Marital status: Married    Spouse name: [name]  . Number of children: Not on file  . Years of education: Not on file  . Highest education level: Not on file  Occupational History  . Occupation: Theme park manager  Social Needs  . Financial resource strain: Not on file  . Food insecurity:    Worry: Not on file    Inability: Not on file  . Transportation needs:    Medical: Not on file    Non-medical: Not on file  Tobacco Use  . Smoking status: Current Some Day Smoker    Types: Pipe, Cigars  . Smokeless tobacco: Never Used  Substance and  Sexual Activity  . Alcohol use: Yes    Comment: social  . Drug use: Never  . Sexual activity: Yes    Partners: Female  Lifestyle  . Physical activity:    Days per week: Not on file    Minutes per session: Not on file  . Stress: Not on file  Relationships  . Social connections:    Talks on phone: Not on file    Gets together: Not on file    Attends religious service: Not on file    Active member of club or organization: Not on file    Attends meetings of clubs or organizations: Not on file    Relationship status: Not on file  . Intimate partner violence:    Fear of current or ex partner: Not on file    Emotionally abused: Not on file    Physically abused: Not on file    Forced sexual activity: Not on file  Other Topics Concern  . Not on file  Social History Narrative  . Not on file    ROS Review of Systems  Objective:   Today's Vitals: BP 126/78   Pulse 83   Temp 98.7 F (37.1 C) (Oral)   Ht 5\' 9"  (1.753  m)   Wt 214 lb (97.1 kg)   SpO2 98%   BMI 31.60 kg/m   Physical Exam Constitutional:      Appearance: He is well-developed.  HENT:     Head: Normocephalic and atraumatic.  Cardiovascular:     Rate and Rhythm: Normal rate and regular rhythm.     Heart sounds: Normal heart sounds.     Comments: No carotid bruits Pulmonary:     Effort: Pulmonary effort is normal.     Breath sounds: Normal breath sounds.  Skin:    General: Skin is warm and dry.  Neurological:     Mental Status: He is alert and oriented to person, place, and time.  Psychiatric:        Behavior: Behavior normal.     Assessment & Plan:   Problem List Items Addressed This Visit      Cardiovascular and Mediastinum   PVC's (premature ventricular contractions)    Rarely symptomatic.       Relevant Medications   lisinopril (PRINIVIL,ZESTRIL) 2.5 MG tablet   pravastatin (PRAVACHOL) 20 MG tablet   aspirin EC 81 MG tablet     Endocrine   Controlled diabetes mellitus type 2 with  complications ([place]) - Primary    Currently on metformin.  His dose was increased to 1000 milligrams about 4 months ago.  Due for monofilament foot exam today and urine microalbumin. He reports labs done in the fall.        Relevant Medications   lisinopril (PRINIVIL,ZESTRIL) 2.5 MG tablet   pravastatin (PRAVACHOL) 20 MG tablet   metFORMIN (GLUCOPHAGE) 1000 MG tablet   aspirin EC 81 MG tablet   Other Relevant Orders   POCT HgB A1C (Completed)   POCT UA - Microalbumin (Completed)     Other   PURE HYPERCHOLESTEROLEMIA    Will try to get last copy of lipids.       Relevant Medications   lisinopril (PRINIVIL,ZESTRIL) 2.5 MG tablet   pravastatin (PRAVACHOL) 20 MG tablet   aspirin EC 81 MG tablet    Other Visit Diagnoses    Screen for colon cancer       Relevant Orders   Ambulatory referral to Gastroenterology   Needs flu shot       Relevant Orders   Flu Vaccine QUAD 6+ mos PF IM (Fluarix Quad PF) (Completed)      Outpatient Encounter Medications as of 06/29/2018  Medication Sig  . aspirin EC 81 MG tablet Take 81 mg by mouth daily.  Marland Kitchen lisinopril (PRINIVIL,ZESTRIL) 2.5 MG tablet Take 2.5 mg by mouth daily.   . metFORMIN (GLUCOPHAGE) 1000 MG tablet TAKE 1 TABLET BY MOUTH TWICE A DAY WITH BREAKFAST AND DINNER  . pravastatin (PRAVACHOL) 20 MG tablet Take 20 mg by mouth daily.   . [DISCONTINUED] fenofibrate 160 MG tablet TAKE 1 TABLET EVERY DAY FOR CHOLESTEROL  . [DISCONTINUED] metoprolol succinate (TOPROL-XL) 25 MG 24 hr tablet Take 1 tablet (25 mg total) by mouth daily.   No facility-administered encounter medications on file as of 06/29/2018.     Follow-up: Return in about 3 months (around 09/27/2018) for Diabetes .   Beatrice Lecher, MD

## 2018-06-29 NOTE — Assessment & Plan Note (Signed)
Will try to get last copy of lipids.

## 2018-06-29 NOTE — Assessment & Plan Note (Addendum)
Currently on metformin.  His dose was increased to 1000 milligrams about 4 months ago.  Due for monofilament foot exam today and urine microalbumin. He reports labs done in the fall.

## 2018-06-30 ENCOUNTER — Telehealth: Payer: Self-pay | Admitting: *Deleted

## 2018-06-30 NOTE — Telephone Encounter (Signed)
lvm advising him that [facility] doesn't have a record of him being a pt at their practice.   Asked that he check this information and call back so that his records can be updated.Marland KitchenMarland KitchenElouise Munroe, [place]

## 2018-08-16 LAB — HM DIABETES EYE EXAM

## 2018-09-07 ENCOUNTER — Encounter: Payer: Self-pay | Admitting: Family Medicine

## 2018-09-07 NOTE — Progress Notes (Signed)
Patient uses [facility].

## 2018-09-27 ENCOUNTER — Ambulatory Visit (INDEPENDENT_AMBULATORY_CARE_PROVIDER_SITE_OTHER): Payer: Federal, State, Local not specified - PPO | Admitting: Family Medicine

## 2018-09-27 ENCOUNTER — Ambulatory Visit: Payer: Federal, State, Local not specified - PPO | Admitting: Osteopathic Medicine

## 2018-09-27 ENCOUNTER — Encounter: Payer: Self-pay | Admitting: Family Medicine

## 2018-09-27 VITALS — Wt 208.0 lb

## 2018-09-27 DIAGNOSIS — E118 Type 2 diabetes mellitus with unspecified complications: Secondary | ICD-10-CM

## 2018-09-27 DIAGNOSIS — E7800 Pure hypercholesterolemia, unspecified: Secondary | ICD-10-CM

## 2018-09-27 MED ORDER — DAPAGLIFLOZIN PROPANEDIOL 5 MG PO TABS: 5.0000 mg | ORAL_TABLET | Freq: Every day | ORAL | 0 refills | Status: DC

## 2018-09-27 NOTE — Progress Notes (Signed)
BS:195 Pt reports that his BS has been going up and would like to discuss changing medication. He reports that his BS have been higher in the morning. And this is before meals.  Maryruth Eve, [place] Crocker, CMA

## 2018-09-27 NOTE — Patient Instructions (Signed)
He will call me in 1 month and let me know how he is doing with the [place] and if he would like to continue it or if he would like for [place] to try something different like Januvia.

## 2018-09-27 NOTE — Progress Notes (Signed)
Virtual Visit via Video Note  I connected with Benjamin Wright on 09/27/18 at  8:10 AM EDT by a video enabled telemedicine application and verified that I am speaking with the correct person using two identifiers.   I discussed the limitations of evaluation and management by telemedicine and the availability of in person appointments. The patient expressed understanding and agreed to proceed.  Pt was at home and I was in my office for the virtual visit.     Subjective:    CC: F/U DM   HPI: Diabetes - no hypoglycemic events. No wounds or sores that are not healing well. No increased thirst or urination. [name]. Taking medications as prescribed without any side effects. Got a meter.  Also has had acces to diabetes counselor.  Sugars have been high first things in the morning.  Under 180 under meals.  As high as 240 in the morning.   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  Follow-up hyperlipidemia-tolerating pravastatin well without any side effects.  Just refilled the medication.  Due for up-to-date labs.  Past medical history, Surgical history, Family history not pertinant except as noted below, Social history, Allergies, and medications have been entered into the medical record, reviewed, and corrections made.   Review of Systems: No fevers, chills, night sweats, weight loss, chest pain, or shortness of breath.   Objective:    General: Speaking clearly in complete sentences without any shortness of breath.  Alert and oriented x3.  Normal judgment. No apparent acute distress.    Impression and Recommendations:    DM -sounds like he still struggling with fasting blood sugars that are still getting into the 200s.  We discussed options.  I did encourage him to really work on increasing his activity and exercise level which would make a difference.  We also discussed adding a second medication to his metformin.  We will try Wilder Glade warned about  potential side effects.  Prescription sent for 30 days for him to try since he just filled a 90-day prescription of his metformin these 2 can be combined if he does really well with it.  Did order some labs and encouraged him to go at his convenience.  I am excited that he has been getting some nutrition counseling and hopefully this will be helpful in the long run.  Hyperlipidemia-due to recheck lipids.  Currently on 40 mg daily.      I discussed the assessment and treatment plan with the patient. The patient was provided an opportunity to ask questions and all were answered. The patient agreed with the plan and demonstrated an understanding of the instructions.   The patient was advised to call back or seek an in-person evaluation if the symptoms worsen or if the condition fails to improve as anticipated.   Beatrice Lecher, MD

## 2018-10-20 ENCOUNTER — Other Ambulatory Visit: Payer: Self-pay | Admitting: Family Medicine

## 2018-11-02 ENCOUNTER — Encounter: Payer: Self-pay | Admitting: Internal Medicine

## 2018-11-02 ENCOUNTER — Other Ambulatory Visit: Payer: Self-pay | Admitting: Family Medicine

## 2018-11-02 DIAGNOSIS — Z1211 Encounter for screening for malignant neoplasm of colon: Secondary | ICD-10-CM

## 2018-11-02 NOTE — Progress Notes (Signed)
Screening colonoscopy order placed.  Patient's wife, Benjamin Wright came in today and requested the order be entered.

## 2018-11-03 ENCOUNTER — Other Ambulatory Visit: Payer: Self-pay | Admitting: Family Medicine

## 2018-11-03 LAB — COMPLETE METABOLIC PANEL WITH GFR
AG Ratio: 2 (calc) (ref 1.0–2.5)
ALT: 18 U/L (ref 9–46)
AST: 13 U/L (ref 10–35)
Albumin: 4.3 g/dL (ref 3.6–5.1)
Alkaline phosphatase (APISO): 67 U/L (ref 35–144)
BUN: 17 mg/dL (ref 7–25)
CO2: 26 mmol/L (ref 20–32)
Calcium: 9.5 mg/dL (ref 8.6–10.3)
Chloride: 104 mmol/L (ref 98–110)
Creat: 0.96 mg/dL (ref 0.70–1.25)
GFR, Est African American: 99 mL/min/{1.73_m2} (ref >=60)
GFR, Est Non African American: 86 mL/min/{1.73_m2} (ref >=60)
Globulin: 2.2 g/dL (calc) (ref 1.9–3.7)
Glucose, Bld: 172 mg/dL — ABNORMAL HIGH (ref 65–99)
Potassium: 4.7 mmol/L (ref 3.5–5.3)
Sodium: 139 mmol/L (ref 135–146)
Total Bilirubin: 0.6 mg/dL (ref 0.2–1.2)
Total Protein: 6.5 g/dL (ref 6.1–8.1)

## 2018-11-03 LAB — LIPID PANEL
Cholesterol: 161 mg/dL (ref <200)
HDL: 35 mg/dL — ABNORMAL LOW (ref >=40)
LDL Cholesterol (Calc): 93 mg/dL (calc)
Non-HDL Cholesterol (Calc): 126 mg/dL (calc) (ref <130)
Total CHOL/HDL Ratio: 4.6 (calc) (ref <5.0)
Triglycerides: 239 mg/dL — ABNORMAL HIGH (ref <150)

## 2018-11-03 LAB — HEMOGLOBIN A1C
Hgb A1c MFr Bld: 7.4 % of total Hgb — ABNORMAL HIGH (ref <5.7)
Mean Plasma Glucose: 166 (calc)
eAG (mmol/L): 9.2 (calc)

## 2018-11-03 MED ORDER — DAPAGLIFLOZIN PRO-METFORMIN ER 10-1000 MG PO TB24: 1.0000 | ORAL_TABLET | Freq: Every day | ORAL | 4 refills | Status: DC

## 2018-11-18 DIAGNOSIS — N486 Induration penis plastica: Secondary | ICD-10-CM | POA: Insufficient documentation

## 2018-11-23 DIAGNOSIS — B379 Candidiasis, unspecified: Secondary | ICD-10-CM | POA: Insufficient documentation

## 2018-12-09 ENCOUNTER — Ambulatory Visit (AMBULATORY_SURGERY_CENTER): Payer: Self-pay

## 2018-12-09 ENCOUNTER — Other Ambulatory Visit: Payer: Self-pay

## 2018-12-09 VITALS — Ht 69.0 in | Wt 205.0 lb

## 2018-12-09 DIAGNOSIS — Z1211 Encounter for screening for malignant neoplasm of colon: Secondary | ICD-10-CM

## 2018-12-09 MED ORDER — NA SULFATE-K SULFATE-MG SULF 17.5-3.13-1.6 GM/177ML PO SOLN: 1.0000 | Freq: Once | ORAL | 0 refills | Status: AC

## 2018-12-09 NOTE — Progress Notes (Signed)
Denies allergies to eggs or soy products. Denies complication of anesthesia or sedation. Denies use of weight loss medication. Denies use of O2.   Emmi instructions given for colonoscopy.  Pre-Visit was conducted by phone due to Covid 19. Instructions were reviewed and mailed to patients confirmed home address. A 15.00 coupon for Suprep was given to the patient. Patient was encouraged to call if he had any questions regarding instructions.  

## 2018-12-15 ENCOUNTER — Encounter: Payer: Self-pay | Admitting: Family Medicine

## 2018-12-16 MED ORDER — FLUCONAZOLE 150 MG PO TABS: 150.0000 mg | ORAL_TABLET | Freq: Every day | ORAL | 0 refills | Status: DC

## 2018-12-20 NOTE — Telephone Encounter (Signed)
Not sure what he means? Does he want Urology referral.  With the diflucan he just takes one a day. We may need to call him to clarify.

## 2018-12-22 ENCOUNTER — Telehealth: Payer: Self-pay | Admitting: Internal Medicine

## 2018-12-22 NOTE — Telephone Encounter (Signed)

## 2018-12-23 ENCOUNTER — Other Ambulatory Visit: Payer: Self-pay

## 2018-12-23 ENCOUNTER — Encounter: Payer: Self-pay | Admitting: Internal Medicine

## 2018-12-23 ENCOUNTER — Ambulatory Visit (AMBULATORY_SURGERY_CENTER): Payer: Federal, State, Local not specified - PPO | Admitting: Internal Medicine

## 2018-12-23 VITALS — BP 115/71 | HR 55 | Temp 98.6°F | Resp 19 | Ht 69.0 in | Wt 205.0 lb

## 2018-12-23 DIAGNOSIS — D120 Benign neoplasm of cecum: Secondary | ICD-10-CM | POA: Diagnosis not present

## 2018-12-23 DIAGNOSIS — Z1211 Encounter for screening for malignant neoplasm of colon: Secondary | ICD-10-CM | POA: Diagnosis present

## 2018-12-23 DIAGNOSIS — D123 Benign neoplasm of transverse colon: Secondary | ICD-10-CM | POA: Diagnosis not present

## 2018-12-23 MED ORDER — SODIUM CHLORIDE 0.9 % IV SOLN: 500.0000 mL | Freq: Once | INTRAVENOUS | Status: DC

## 2018-12-23 NOTE — Progress Notes (Signed)
Pt's states no medical or surgical changes since previsit or office visit.  Temp taken by DS VS taken by CW

## 2018-12-23 NOTE — Op Note (Signed)
[place] Patient Name: Benjamin Wright Procedure Date: 12/23/2018 10:46 AM MRN: [id number] Endoscopist: Jerene Bears , MD Age: 58 Referring MD:  Date of Birth: [birth year] Gender: Male Account #: [id number] Procedure:                Colonoscopy Indications:              Screening for colorectal malignant neoplasm, Last                            colonoscopy 10 years ago Medicines:                Monitored Anesthesia Care Procedure:                Pre-Anesthesia Assessment:                           - Prior to the procedure, a History and Physical                            was performed, and patient medications and                            allergies were reviewed. The patient's tolerance of                            previous anesthesia was also reviewed. The risks                            and benefits of the procedure and the sedation                            options and risks were discussed with the patient.                            All questions were answered, and informed consent                            was obtained. Prior Anticoagulants: The patient has                            taken no previous anticoagulant or antiplatelet                            agents. ASA Grade Assessment: II - A patient with                            mild systemic disease. After reviewing the risks                            and benefits, the patient was deemed in                            satisfactory condition to undergo the procedure.  After obtaining informed consent, the colonoscope                            was passed under direct vision. Throughout the                            procedure, the patient's blood pressure, pulse, and                            oxygen saturations were monitored continuously. The                            Colonoscope was introduced through the anus and                            advanced to the cecum, identified by  appendiceal                            orifice and ileocecal valve. The colonoscopy was                            performed without difficulty. The patient tolerated                            the procedure well. The quality of the bowel                            preparation was good. The ileocecal valve,                            appendiceal orifice, and rectum were photographed. Scope In: 11:00:36 AM Scope Out: 11:20:20 AM Scope Withdrawal Time: 0 hours 16 minutes 26 seconds  Total Procedure Duration: 0 hours 19 minutes 44 seconds  Findings:                 The digital rectal exam was normal.                           A 1 mm polyp was found in the cecum. The polyp was                            sessile. The polyp was removed with a cold biopsy                            forceps. Resection and retrieval were complete.                           A 4 mm polyp was found in the cecum. The polyp was                            sessile. The polyp was removed with a cold snare.  Resection and retrieval were complete.                           Two sessile polyps were found in the transverse                            colon. The polyps were 5 to 6 mm in size. These                            polyps were removed with a cold snare. Resection                            and retrieval were complete.                           Multiple small-mouthed diverticula were found in                            the sigmoid colon.                           Internal hemorrhoids were found during                            retroflexion. The hemorrhoids were small. Complications:            No immediate complications. Estimated Blood Loss:     Estimated blood loss was minimal. Impression:               - One 1 mm polyp in the cecum, removed with a cold                            biopsy forceps. Resected and retrieved.                           - One 4 mm polyp in the cecum, removed with a  cold                            snare. Resected and retrieved.                           - Two 5 to 6 mm polyps in the transverse colon,                            removed with a cold snare. Resected and retrieved.                           - Diverticulosis in the sigmoid colon.                           - Small internal hemorrhoids. Recommendation:           - Patient has a contact number available for  emergencies. The signs and symptoms of potential                            delayed complications were discussed with the                            patient. Return to normal activities tomorrow.                            Written discharge instructions were provided to the                            patient.                           - Resume previous diet.                           - Continue present medications.                           - Await pathology results.                           - Repeat colonoscopy is recommended for                            surveillance. The colonoscopy date will be                            determined after pathology results from today's                            exam become available for review. Jerene Bears, MD 12/23/2018 11:23:53 AM This report has been signed electronically.

## 2018-12-23 NOTE — Progress Notes (Signed)
Called to room to assist during endoscopic procedure.  Patient ID and intended procedure confirmed with present staff. Received instructions for my participation in the procedure from the performing physician.  

## 2018-12-23 NOTE — Progress Notes (Signed)
To PACU, VSS. Report to [email or web address] 

## 2018-12-23 NOTE — Patient Instructions (Signed)
Impression/Recommendations:  Polyp handout given to patient. Diverticulosis handout given to patient. Hemorrhoid handout given to patient.  Resume previous diet. Continue present medications. Await pathology results.  Repeat colonoscopy recommended for surveillance.  Date to be determined after pathology results reviewed.  YOU HAD AN ENDOSCOPIC PROCEDURE TODAY AT [facility]:   Refer to the procedure report that was given to you for any specific questions about what was found during the examination.  If the procedure report does not answer your questions, please call your gastroenterologist to clarify.  If you requested that your care partner not be given the details of your procedure findings, then the procedure report has been included in a sealed envelope for you to review at your convenience later.  YOU SHOULD EXPECT: Some feelings of bloating in the abdomen. Passage of more gas than usual.  Walking can help get rid of the air that was put into your GI tract during the procedure and reduce the bloating. If you had a lower endoscopy (such as a colonoscopy or flexible sigmoidoscopy) you may notice spotting of blood in your stool or on the toilet paper. If you underwent a bowel prep for your procedure, you may not have a normal bowel movement for a few days.  Please Note:  You might notice some irritation and congestion in your nose or some drainage.  This is from the oxygen used during your procedure.  There is no need for concern and it should clear up in a day or so.  SYMPTOMS TO REPORT IMMEDIATELY:   Following lower endoscopy (colonoscopy or flexible sigmoidoscopy):  Excessive amounts of blood in the stool  Significant tenderness or worsening of abdominal pains  Swelling of the abdomen that is new, acute  Fever of 100F or higher  For urgent or emergent issues, a gastroenterologist can be reached at any hour by calling [phone].   DIET:  We do recommend a  small meal at first, but then you may proceed to your regular diet.  Drink plenty of fluids but you should avoid alcoholic beverages for 24 hours.  ACTIVITY:  You should plan to take it easy for the rest of today and you should NOT DRIVE or use heavy machinery until tomorrow (because of the sedation medicines used during the test).    FOLLOW UP: Our staff will call the number listed on your records 48-72 hours following your procedure to check on you and address any questions or concerns that you may have regarding the information given to you following your procedure. If we do not reach you, we will leave a message.  We will attempt to reach you two times.  During this call, we will ask if you have developed any symptoms of COVID 19. If you develop any symptoms (ie: fever, flu-like symptoms, shortness of breath, cough etc.) before then, please call [phone].  If you test positive for Covid 19 in the 2 weeks post procedure, please call and report this information to [place].    If any biopsies were taken you will be contacted by phone or by letter within the next 1-3 weeks.  Please call us at [phone] if you have not heard about the biopsies in 3 weeks.    SIGNATURES/CONFIDENTIALITY: You and/or your care partner have signed paperwork which will be entered into your electronic medical record.  These signatures attest to the fact that that the information above on your After Visit Summary has been reviewed and is understood.  Full responsibility of the confidentiality of this discharge information lies with you and/or your care-partner.

## 2018-12-27 ENCOUNTER — Telehealth: Payer: Self-pay

## 2018-12-27 NOTE — Telephone Encounter (Signed)
  Follow up Call-  Call back number 12/23/2018  Post procedure Call Back phone  # [phone]  Permission to leave phone message Yes  Some recent data might be hidden     Patient questions:  Do you have a fever, pain , or abdominal swelling? No. Pain Score  0 *  Have you tolerated food without any problems? Yes.    Have you been able to return to your normal activities? Yes.    Do you have any questions about your discharge instructions: Diet   No. Medications  No. Follow up visit  No.  Do you have questions or concerns about your Care? No.  Actions: * If pain score is 4 or above: 1. No action needed, pain <4.Have you developed a fever since your procedure? no  2.   Have you had an respiratory symptoms (SOB or cough) since your procedure? no  3.   Have you tested positive for COVID 19 since your procedure no  4.   Have you had any family members/close contacts diagnosed with the COVID 19 since your procedure?  no   If yes to any of these questions please route to Joylene John, RN and Alphonsa Gin, Therapist, sports.

## 2018-12-28 ENCOUNTER — Ambulatory Visit: Payer: Federal, State, Local not specified - PPO | Admitting: Family Medicine

## 2018-12-29 ENCOUNTER — Encounter: Payer: Self-pay | Admitting: Internal Medicine

## 2019-01-02 ENCOUNTER — Ambulatory Visit: Payer: Federal, State, Local not specified - PPO | Admitting: Family Medicine

## 2019-01-02 ENCOUNTER — Other Ambulatory Visit: Payer: Self-pay

## 2019-01-02 ENCOUNTER — Encounter: Payer: Self-pay | Admitting: Family Medicine

## 2019-01-02 VITALS — BP 138/81 | HR 86 | Ht 69.0 in | Wt 209.0 lb

## 2019-01-02 DIAGNOSIS — E118 Type 2 diabetes mellitus with unspecified complications: Secondary | ICD-10-CM | POA: Diagnosis not present

## 2019-01-02 DIAGNOSIS — E7800 Pure hypercholesterolemia, unspecified: Secondary | ICD-10-CM

## 2019-01-02 DIAGNOSIS — Z7184 Encounter for health counseling related to travel: Secondary | ICD-10-CM | POA: Diagnosis not present

## 2019-01-02 DIAGNOSIS — N486 Induration penis plastica: Secondary | ICD-10-CM

## 2019-01-02 LAB — POCT GLYCOSYLATED HEMOGLOBIN (HGB A1C): Hemoglobin A1C: 7.3 % — AB (ref 4.0–5.6)

## 2019-01-02 MED ORDER — AZITHROMYCIN 250 MG PO TABS: ORAL_TABLET | ORAL | 0 refills | Status: AC

## 2019-01-02 MED ORDER — OZEMPIC (0.25 OR 0.5 MG/DOSE) 2 MG/1.5ML ~~LOC~~ SOPN: PEN_INJECTOR | SUBCUTANEOUS | 1 refills | Status: AC

## 2019-01-02 MED ORDER — PRAVASTATIN SODIUM 20 MG PO TABS: 20.0000 mg | ORAL_TABLET | Freq: Every day | ORAL | 3 refills | Status: DC

## 2019-01-02 NOTE — Assessment & Plan Note (Signed)
Not well controlled today.  He feels he does well with his diet.  Discussed options. Will add trulicity.  F/U in 3 months.

## 2019-01-02 NOTE — Progress Notes (Signed)
Established Patient Office Visit  Subjective:  Patient ID: Benjamin Wright, male    DOB: 1960/10/08  Age: 58 y.o. MRN: [id number]  CC: No chief complaint on file.   HPI HADY NIEMCZYK presents for   Diabetes - no hypoglycemic events. No wounds or sores that are not healing well. No increased thirst or urination. [name]. Taking medications as prescribed without any side effects.  He says post male his blood sugars have been really great.  Except occasionally if he eats something but most the time he feels like he actually does really well with his diet overall.  He mostly drinks water.  This is been a struggle or his fasting sugars which are still running in the 160s.  He says he is taking his medication regularly.  He is actually traveling to the [place] in about a week for vacation and wanted to know if he could get an antibiotic for traveler's diarrhea just in case.   Past Medical History:  Diagnosis Date  . Atrial fibrillation ([place])   . Diabetes mellitus without complication ([place])   . Hyperlipidemia   . Hypertension     Past Surgical History:  Procedure Laterality Date  . cyst removal from ankle    . finger reattachment    . VASECTOMY      Family History  Problem Relation Age of Onset  . Diabetes Mother   . Heart attack Father   . Stomach cancer Maternal Grandfather   . Colon cancer Neg Hx   . Esophageal cancer Neg Hx   . Rectal cancer Neg Hx     Social History   Socioeconomic History  . Marital status: Married    Spouse name: [name]  . Number of children: Not on file  . Years of education: Not on file  . Highest education level: Not on file  Occupational History  . Occupation: Theme park manager  Social Needs  . Financial resource strain: Not on file  . Food insecurity    Worry: Not on file    Inability: Not on file  . Transportation needs    Medical: Not on file    Non-medical: Not on file  Tobacco Use  . Smoking status: Current Some Day  Smoker    Types: Pipe, Cigars  . Smokeless tobacco: Never Used  Substance and Sexual Activity  . Alcohol use: Yes    Comment: social  . Drug use: Never  . Sexual activity: Yes    Partners: Female  Lifestyle  . Physical activity    Days per week: Not on file    Minutes per session: Not on file  . Stress: Not on file  Relationships  . Social Herbalist on phone: Not on file    Gets together: Not on file    Attends religious service: Not on file    Active member of club or organization: Not on file    Attends meetings of clubs or organizations: Not on file    Relationship status: Not on file  . Intimate partner violence    Fear of current or ex partner: Not on file    Emotionally abused: Not on file    Physically abused: Not on file    Forced sexual activity: Not on file  Other Topics Concern  . Not on file  Social History Narrative  . Not on file    Outpatient Medications Prior to Visit  Medication Sig Dispense Refill  . aspirin  EC 81 MG tablet Take 81 mg by mouth daily.    . Dapagliflozin-metFORMIN HCl ER 02-999 MG TB24 Take 1 tablet by mouth daily. 30 tablet 4  . lisinopril (PRINIVIL,ZESTRIL) 2.5 MG tablet Take 2.5 mg by mouth daily.     . metFORMIN (GLUCOPHAGE) 1000 MG tablet TAKE 1 TABLET BY MOUTH TWICE A DAY WITH BREAKFAST AND DINNER    . pravastatin (PRAVACHOL) 20 MG tablet Take 20 mg by mouth daily.     . fluconazole (DIFLUCAN) 150 MG tablet Take 1 tablet (150 mg total) by mouth daily. 3 tablet 0  . 0.9 %  sodium chloride infusion      No facility-administered medications prior to visit.     No Known Allergies  ROS Review of Systems    Objective:    Physical Exam  Constitutional: He is oriented to person, place, and time. He appears well-developed and well-nourished.  HENT:  Head: Normocephalic and atraumatic.  Cardiovascular: Normal rate, regular rhythm and normal heart sounds.  Pulmonary/Chest: Effort normal and breath sounds normal.   Neurological: He is alert and oriented to person, place, and time.  Skin: Skin is warm and dry.  Psychiatric: He has a normal mood and affect. His behavior is normal.    BP 138/81   Pulse 86   Ht 5\' 9"  (1.753 m)   Wt 209 lb (94.8 kg)   SpO2 97%   BMI 30.86 kg/m  Wt Readings from Last 3 Encounters:  01/02/19 209 lb (94.8 kg)  12/23/18 205 lb (93 kg)  12/09/18 205 lb (93 kg)     Health Maintenance Due  Topic Date Due  . PNEUMOCOCCAL POLYSACCHARIDE VACCINE AGE 72-64 HIGH RISK  08/26/1960  . HIV Screening  08/26/1973  . INFLUENZA VACCINE  12/24/2018    There are no preventive care reminders to display for this patient.  No results found for: TSH Lab Results  Component Value Date   WBC 11.2 (H) 03/24/2011   HGB 16.8 03/24/2011   HCT 46.2 03/24/2011   MCV 87.0 03/24/2011   PLT 161 03/24/2011   Lab Results  Component Value Date   NA 139 11/02/2018   K 4.7 11/02/2018   CO2 26 11/02/2018   GLUCOSE 172 (H) 11/02/2018   BUN 17 11/02/2018   CREATININE 0.96 11/02/2018   BILITOT 0.6 11/02/2018   ALKPHOS 55 01/17/2009   AST 13 11/02/2018   ALT 18 11/02/2018   PROT 6.5 11/02/2018   ALBUMIN 4.3 01/17/2009   CALCIUM 9.5 11/02/2018   Lab Results  Component Value Date   CHOL 161 11/02/2018   Lab Results  Component Value Date   HDL 35 (L) 11/02/2018   Lab Results  Component Value Date   LDLCALC 93 11/02/2018   Lab Results  Component Value Date   TRIG 239 (H) 11/02/2018   Lab Results  Component Value Date   CHOLHDL 4.6 11/02/2018   Lab Results  Component Value Date   HGBA1C 7.3 (A) 01/02/2019      Assessment & Plan:   Problem List Items Addressed This Visit      Endocrine   Controlled diabetes mellitus type 2 with complications ([place]) - Primary    Not well controlled today.  He feels he does well with his diet.  Discussed options. Will add trulicity.  F/U in 3 months.       Relevant Medications   pravastatin (PRAVACHOL) 20 MG tablet    Semaglutide,0.25 or 0.5MG /DOS, (OZEMPIC, 0.25 OR 0.5 MG/DOSE,) 2  MG/1.5ML SOPN   Other Relevant Orders   POCT glycosylated hemoglobin (Hb A1C) (Completed)     Other   PURE HYPERCHOLESTEROLEMIA    Refill pravastatin for 1 year today.  Labs are up-to-date.      Relevant Medications   pravastatin (PRAVACHOL) 20 MG tablet    Other Visit Diagnoses    Travel advice encounter          Travel advice-did go ahead and send over azithromycin to use as needed for traveler's diarrhea.  Meds ordered this encounter  Medications  . pravastatin (PRAVACHOL) 20 MG tablet    Sig: Take 1 tablet (20 mg total) by mouth daily.    Dispense:  90 tablet    Refill:  3  . azithromycin (ZITHROMAX) 250 MG tablet    Sig: 2 Ttabs PO on Day 1, then one a day x 4 days.    Dispense:  6 tablet    Refill:  0  . Semaglutide,0.25 or 0.5MG /DOS, (OZEMPIC, 0.25 OR 0.5 MG/DOSE,) 2 MG/1.5ML SOPN    Sig: Inject 0.25 mg into the skin once a week for 28 days, THEN 0.5 mg once a week for 28 days.    Dispense:  4 pen    Refill:  1    Follow-up: Return in about 3 months (around 04/04/2019) for Diabetes follow-up.    Beatrice Lecher, MD

## 2019-01-02 NOTE — Assessment & Plan Note (Signed)
Refill pravastatin for 1 year today.  Labs are up-to-date.

## 2019-01-05 ENCOUNTER — Encounter: Payer: Self-pay | Admitting: Family Medicine

## 2019-01-05 MED ORDER — TRULICITY 0.75 MG/0.5ML ~~LOC~~ SOAJ: 0.7500 mg | SUBCUTANEOUS | 3 refills | Status: DC

## 2019-01-09 ENCOUNTER — Encounter: Payer: Self-pay | Admitting: Family Medicine

## 2019-01-11 ENCOUNTER — Encounter: Payer: Self-pay | Admitting: Family Medicine

## 2019-01-18 ENCOUNTER — Encounter: Payer: Self-pay | Admitting: Family Medicine

## 2019-01-18 NOTE — Telephone Encounter (Signed)
Can we check on the referral before I respond back to him? Than kyou

## 2019-01-19 NOTE — Telephone Encounter (Signed)
Gave to [place] [facility].

## 2019-01-26 ENCOUNTER — Other Ambulatory Visit: Payer: Self-pay | Admitting: Family Medicine

## 2019-01-27 ENCOUNTER — Other Ambulatory Visit: Payer: Self-pay | Admitting: *Deleted

## 2019-02-15 NOTE — Telephone Encounter (Signed)
Resent referral - CF °

## 2019-04-04 ENCOUNTER — Telehealth: Payer: Self-pay | Admitting: Family Medicine

## 2019-04-04 ENCOUNTER — Ambulatory Visit: Payer: Federal, State, Local not specified - PPO | Admitting: Family Medicine

## 2019-04-04 ENCOUNTER — Encounter: Payer: Self-pay | Admitting: Family Medicine

## 2019-04-04 NOTE — Progress Notes (Deleted)
Established Patient Office Visit  Subjective:  Patient ID: Benjamin Wright, male    DOB: March 05, 1961  Age: 58 y.o. MRN: [id number]  CC: No chief complaint on file.   HPI MENSAH GOSNELL presents for   Diabetes - no hypoglycemic events. No wounds or sores that are not healing well. No increased thirst or urination. [name]. Taking medications as prescribed without any side effects.   Past Medical History:  Diagnosis Date  . Atrial fibrillation ([place])   . Diabetes mellitus without complication ([place])   . Hyperlipidemia   . Hypertension     Past Surgical History:  Procedure Laterality Date  . cyst removal from ankle    . finger reattachment    . VASECTOMY      Family History  Problem Relation Age of Onset  . Diabetes Mother   . Heart attack Father   . Stomach cancer Maternal Grandfather   . Colon cancer Neg Hx   . Esophageal cancer Neg Hx   . Rectal cancer Neg Hx     Social History   Socioeconomic History  . Marital status: Married    Spouse name: [name]  . Number of children: Not on file  . Years of education: Not on file  . Highest education level: Not on file  Occupational History  . Occupation: Theme park manager  Social Needs  . Financial resource strain: Not on file  . Food insecurity    Worry: Not on file    Inability: Not on file  . Transportation needs    Medical: Not on file    Non-medical: Not on file  Tobacco Use  . Smoking status: Current Some Day Smoker    Types: Pipe, Cigars  . Smokeless tobacco: Never Used  Substance and Sexual Activity  . Alcohol use: Yes    Comment: social  . Drug use: Never  . Sexual activity: Yes    Partners: Female  Lifestyle  . Physical activity    Days per week: Not on file    Minutes per session: Not on file  . Stress: Not on file  Relationships  . Social Herbalist on phone: Not on file    Gets together: Not on file    Attends religious service: Not on file    Active member of club or  organization: Not on file    Attends meetings of clubs or organizations: Not on file    Relationship status: Not on file  . Intimate partner violence    Fear of current or ex partner: Not on file    Emotionally abused: Not on file    Physically abused: Not on file    Forced sexual activity: Not on file  Other Topics Concern  . Not on file  Social History Narrative  . Not on file    Outpatient Medications Prior to Visit  Medication Sig Dispense Refill  . aspirin EC 81 MG tablet Take 81 mg by mouth daily.    . Dapagliflozin-metFORMIN HCl ER 02-999 MG TB24 Take 1 tablet by mouth daily. 30 tablet 4  . Dulaglutide (TRULICITY) A999333 0000000 SOPN Inject 0.75 mg into the skin every 7 (seven) days. 4 pen 3  . lisinopril (PRINIVIL,ZESTRIL) 2.5 MG tablet Take 2.5 mg by mouth daily.     . pravastatin (PRAVACHOL) 20 MG tablet Take 1 tablet (20 mg total) by mouth daily. 90 tablet 3  . metFORMIN (GLUCOPHAGE) 1000 MG tablet TAKE 1 TABLET BY MOUTH  TWICE A DAY WITH BREAKFAST AND DINNER     No facility-administered medications prior to visit.     No Known Allergies  ROS Review of Systems    Objective:    Physical Exam  There were no vitals taken for this visit. Wt Readings from Last 3 Encounters:  01/02/19 209 lb (94.8 kg)  12/23/18 205 lb (93 kg)  12/09/18 205 lb (93 kg)     Health Maintenance Due  Topic Date Due  . PNEUMOCOCCAL POLYSACCHARIDE VACCINE AGE 53-64 HIGH RISK  08/26/1960  . HIV Screening  08/26/1973  . INFLUENZA VACCINE  12/24/2018    There are no preventive care reminders to display for this patient.  No results found for: TSH Lab Results  Component Value Date   WBC 11.2 (H) 03/24/2011   HGB 16.8 03/24/2011   HCT 46.2 03/24/2011   MCV 87.0 03/24/2011   PLT 161 03/24/2011   Lab Results  Component Value Date   NA 139 11/02/2018   K 4.7 11/02/2018   CO2 26 11/02/2018   GLUCOSE 172 (H) 11/02/2018   BUN 17 11/02/2018   CREATININE 0.96 11/02/2018   BILITOT  0.6 11/02/2018   ALKPHOS 55 01/17/2009   AST 13 11/02/2018   ALT 18 11/02/2018   PROT 6.5 11/02/2018   ALBUMIN 4.3 01/17/2009   CALCIUM 9.5 11/02/2018   Lab Results  Component Value Date   CHOL 161 11/02/2018   Lab Results  Component Value Date   HDL 35 (L) 11/02/2018   Lab Results  Component Value Date   LDLCALC 93 11/02/2018   Lab Results  Component Value Date   TRIG 239 (H) 11/02/2018   Lab Results  Component Value Date   CHOLHDL 4.6 11/02/2018   Lab Results  Component Value Date   HGBA1C 7.3 (A) 01/02/2019      Assessment & Plan:   Problem List Items Addressed This Visit      Endocrine   Controlled diabetes mellitus type 2 with complications ([place]) - Primary      No orders of the defined types were placed in this encounter.   Follow-up: No follow-ups on file.    Beatrice Lecher, MD

## 2019-04-04 NOTE — Telephone Encounter (Signed)
Okay, thank you.  Do not charge no-show.

## 2019-04-04 NOTE — Telephone Encounter (Signed)
Patient called in stating that he was exposed to Dana because his wife tested positive on Friday. He did not want to do virtual. He has rescheduled. Patient left me a voicemail at 8:06am. No further questions at this time.

## 2019-04-04 NOTE — Telephone Encounter (Signed)
I have canceled appointment so patient does not get a charged a fee.

## 2019-04-05 MED ORDER — LISINOPRIL 2.5 MG PO TABS: 2.5000 mg | ORAL_TABLET | Freq: Every day | ORAL | 0 refills | Status: DC

## 2019-04-05 NOTE — Telephone Encounter (Signed)
To protect kidneys, RF sent.

## 2019-04-16 ENCOUNTER — Other Ambulatory Visit: Payer: Self-pay | Admitting: Family Medicine

## 2019-05-04 ENCOUNTER — Encounter: Payer: Self-pay | Admitting: Family Medicine

## 2019-05-04 ENCOUNTER — Ambulatory Visit (INDEPENDENT_AMBULATORY_CARE_PROVIDER_SITE_OTHER): Payer: Federal, State, Local not specified - PPO | Admitting: Family Medicine

## 2019-05-04 ENCOUNTER — Other Ambulatory Visit: Payer: Self-pay

## 2019-05-04 VITALS — BP 118/71 | HR 88 | Ht 68.9 in | Wt 204.0 lb

## 2019-05-04 DIAGNOSIS — Z23 Encounter for immunization: Secondary | ICD-10-CM | POA: Diagnosis not present

## 2019-05-04 DIAGNOSIS — E118 Type 2 diabetes mellitus with unspecified complications: Secondary | ICD-10-CM | POA: Diagnosis not present

## 2019-05-04 DIAGNOSIS — L603 Nail dystrophy: Secondary | ICD-10-CM | POA: Diagnosis not present

## 2019-05-04 LAB — POCT GLYCOSYLATED HEMOGLOBIN (HGB A1C): Hemoglobin A1C: 6.1 % — AB (ref 4.0–5.6)

## 2019-05-04 NOTE — Assessment & Plan Note (Signed)
A1c looks phenomenal today.  Please stop additional Metformin.  Just take the Xigduo and the Trulicity.  Follow-up in 4 months.  Labs are up-to-date except for BMP we will get that today.

## 2019-05-04 NOTE — Progress Notes (Signed)
Established Patient Office Visit  Subjective:  Patient ID: Benjamin Wright, male    DOB: 05-Sep-1960  Age: 58 y.o. MRN: [id number]  CC:  Chief Complaint  Patient presents with  . Diabetes  . Nail Problem    HPI Benjamin Wright presents for   Diabetes - no hypoglycemic events. No wounds or sores that are not healing well. No increased thirst or urination. [name]. Taking medications as prescribed without any side effects. We addedTrulicity in August.  He is also been taking some leftover Metformin along with the Xigduo.  He is doing well with the addition of the Trulicity.  His blood sugars have mostly been running in the 130s.  That his highest was 187 since I last saw him.  He also complains that his nail on his right great toe will come off about every 3 months.  He says it is been going on for a while.  He did have some major trauma to that toe about 10 years ago when he dropped a very heavy glass shower door on that foot.  He also played sports when he was younger.  Past Medical History:  Diagnosis Date  . Atrial fibrillation ([place])   . Diabetes mellitus without complication ([place])   . Hyperlipidemia   . Hypertension     Past Surgical History:  Procedure Laterality Date  . cyst removal from ankle    . finger reattachment    . VASECTOMY      Family History  Problem Relation Age of Onset  . Diabetes Mother   . Heart attack Father   . Stomach cancer Maternal Grandfather   . Colon cancer Neg Hx   . Esophageal cancer Neg Hx   . Rectal cancer Neg Hx     Social History   Socioeconomic History  . Marital status: Married    Spouse name: [name]  . Number of children: Not on file  . Years of education: Not on file  . Highest education level: Not on file  Occupational History  . Occupation: Pastor  Tobacco Use  . Smoking status: Current Some Day Smoker    Types: Pipe, Cigars  . Smokeless tobacco: Never Used  Substance and Sexual Activity  . Alcohol  use: Yes    Comment: social  . Drug use: Never  . Sexual activity: Yes    Partners: Female  Other Topics Concern  . Not on file  Social History Narrative  . Not on file   Social Determinants of Health   Financial Resource Strain:   . Difficulty of Paying Living Expenses: Not on file  Food Insecurity:   . Worried About Charity fundraiser in the Last Year: Not on file  . Ran Out of Food in the Last Year: Not on file  Transportation Needs:   . Lack of Transportation (Medical): Not on file  . Lack of Transportation (Non-Medical): Not on file  Physical Activity:   . Days of Exercise per Week: Not on file  . Minutes of Exercise per Session: Not on file  Stress:   . Feeling of Stress : Not on file  Social Connections:   . Frequency of Communication with Friends and Family: Not on file  . Frequency of Social Gatherings with Friends and Family: Not on file  . Attends Religious Services: Not on file  . Active Member of Clubs or Organizations: Not on file  . Attends Archivist Meetings: Not on file  .  Marital Status: Not on file  Intimate Partner Violence:   . Fear of Current or Ex-Partner: Not on file  . Emotionally Abused: Not on file  . Physically Abused: Not on file  . Sexually Abused: Not on file    Outpatient Medications Prior to Visit  Medication Sig Dispense Refill  . aspirin EC 81 MG tablet Take 81 mg by mouth daily.    . Dulaglutide (TRULICITY) A999333 0000000 SOPN Inject 0.75 mg into the skin every 7 (seven) days. 4 pen 3  . lisinopril (ZESTRIL) 2.5 MG tablet Take 1 tablet (2.5 mg total) by mouth daily. 90 tablet 0  . pravastatin (PRAVACHOL) 20 MG tablet Take 1 tablet (20 mg total) by mouth daily. 90 tablet 3  . XIGDUO XR 02-999 MG TB24 TAKE 1 TABLET BY MOUTH EVERY DAY 30 tablet 4   No facility-administered medications prior to visit.    No Known Allergies  ROS Review of Systems    Objective:    Physical Exam  Constitutional: He is oriented to  person, place, and time. He appears well-developed and well-nourished.  HENT:  Head: Normocephalic and atraumatic.  Cardiovascular: Normal rate, regular rhythm and normal heart sounds.  Pulmonary/Chest: Effort normal and breath sounds normal.  Neurological: He is alert and oriented to person, place, and time.  Skin: Skin is warm and dry.  Psychiatric: He has a normal mood and affect. His behavior is normal.    BP 118/71   Pulse 88   Ht 5' 8.9" (1.75 m)   Wt 204 lb (92.5 kg)   SpO2 98%   BMI 30.21 kg/m  Wt Readings from Last 3 Encounters:  05/04/19 204 lb (92.5 kg)  01/02/19 209 lb (94.8 kg)  12/23/18 205 lb (93 kg)     Health Maintenance Due  Topic Date Due  . PNEUMOCOCCAL POLYSACCHARIDE VACCINE AGE 42-64 HIGH RISK  08/26/1960  . HIV Screening  08/26/1973  . INFLUENZA VACCINE  12/24/2018    There are no preventive care reminders to display for this patient.  No results found for: TSH Lab Results  Component Value Date   WBC 11.2 (H) 03/24/2011   HGB 16.8 03/24/2011   HCT 46.2 03/24/2011   MCV 87.0 03/24/2011   PLT 161 03/24/2011   Lab Results  Component Value Date   NA 139 11/02/2018   K 4.7 11/02/2018   CO2 26 11/02/2018   GLUCOSE 172 (H) 11/02/2018   BUN 17 11/02/2018   CREATININE 0.96 11/02/2018   BILITOT 0.6 11/02/2018   ALKPHOS 55 01/17/2009   AST 13 11/02/2018   ALT 18 11/02/2018   PROT 6.5 11/02/2018   ALBUMIN 4.3 01/17/2009   CALCIUM 9.5 11/02/2018   Lab Results  Component Value Date   CHOL 161 11/02/2018   Lab Results  Component Value Date   HDL 35 (L) 11/02/2018   Lab Results  Component Value Date   LDLCALC 93 11/02/2018   Lab Results  Component Value Date   TRIG 239 (H) 11/02/2018   Lab Results  Component Value Date   CHOLHDL 4.6 11/02/2018   Lab Results  Component Value Date   HGBA1C 6.1 (A) 05/04/2019      Assessment & Plan:   Problem List Items Addressed This Visit      Endocrine   Controlled diabetes mellitus type 2  with complications ([place]) - Primary    A1c looks phenomenal today.  Please stop additional Metformin.  Just take the Xigduo and the Trulicity.  Follow-up  in 4 months.  Labs are up-to-date except for BMP we will get that today.      Relevant Orders   POCT HgB A1C (Completed)   BASIC METABOLIC PANEL WITH GFR    Other Visit Diagnoses    Dystrophic nail       Relevant Orders   Culture, fungus without smear   Need for prophylactic vaccination against Streptococcus pneumoniae (pneumococcus)       Relevant Orders   Pneumococcal polysaccharide vaccine 23-valent greater than or equal to 2yo subcutaneous/IM (Completed)     Dystrophic nail-most likely secondary to trauma but we did discuss getting a fungal culture just to rule that out.  I gave him a sterile cup to collect the sample once the nail falls off its probably pretty close to actually falling cough.  Explained that it can take a month to get the fungal culture back but we can at least rule that out.  If it is negative then this is definitely a dystrophic nail.  We discussed that there can be definitive treatment for permanent removal if at any point he feels like it is bothersome.  No orders of the defined types were placed in this encounter.   Follow-up: Return in about 4 months (around 09/02/2019) for Diabetes follow-up.    Beatrice Lecher, MD

## 2019-07-10 ENCOUNTER — Other Ambulatory Visit: Payer: Self-pay | Admitting: Family Medicine

## 2019-08-09 ENCOUNTER — Other Ambulatory Visit: Payer: Self-pay | Admitting: Family Medicine

## 2019-08-09 NOTE — Telephone Encounter (Signed)
Must keep appointment

## 2019-09-04 ENCOUNTER — Ambulatory Visit (INDEPENDENT_AMBULATORY_CARE_PROVIDER_SITE_OTHER): Payer: Federal, State, Local not specified - PPO | Admitting: Family Medicine

## 2019-09-04 ENCOUNTER — Other Ambulatory Visit: Payer: Self-pay

## 2019-09-04 ENCOUNTER — Encounter: Payer: Self-pay | Admitting: Family Medicine

## 2019-09-04 VITALS — BP 130/71 | HR 96 | Ht 69.0 in | Wt 209.0 lb

## 2019-09-04 DIAGNOSIS — Z23 Encounter for immunization: Secondary | ICD-10-CM | POA: Diagnosis not present

## 2019-09-04 DIAGNOSIS — E785 Hyperlipidemia, unspecified: Secondary | ICD-10-CM | POA: Diagnosis not present

## 2019-09-04 DIAGNOSIS — E118 Type 2 diabetes mellitus with unspecified complications: Secondary | ICD-10-CM

## 2019-09-04 DIAGNOSIS — E1169 Type 2 diabetes mellitus with other specified complication: Secondary | ICD-10-CM

## 2019-09-04 LAB — POCT GLYCOSYLATED HEMOGLOBIN (HGB A1C): Hemoglobin A1C: 7.1 % — AB (ref 4.0–5.6)

## 2019-09-04 LAB — BASIC METABOLIC PANEL WITH GFR
BUN: 16 mg/dL (ref 7–25)
CO2: 27 mmol/L (ref 20–32)
Calcium: 9.9 mg/dL (ref 8.6–10.3)
Chloride: 103 mmol/L (ref 98–110)
Creat: 1.05 mg/dL (ref 0.70–1.25)
GFR, Est African American: 88 mL/min/{1.73_m2} (ref >=60)
GFR, Est Non African American: 76 mL/min/{1.73_m2} (ref >=60)
Glucose, Bld: 189 mg/dL — ABNORMAL HIGH (ref 65–139)
Potassium: 4.3 mmol/L (ref 3.5–5.3)
Sodium: 139 mmol/L (ref 135–146)

## 2019-09-04 MED ORDER — TRULICITY 1.5 MG/0.5ML ~~LOC~~ SOAJ: 1.5000 mg | SUBCUTANEOUS | 5 refills | Status: DC

## 2019-09-04 NOTE — Progress Notes (Signed)
Established Patient Office Visit  Subjective:  Patient ID: Benjamin Wright, male    DOB: 1961/01/17  Age: 59 y.o. MRN: [id number]  CC:  Chief Complaint  Patient presents with  . Diabetes  . Hypertension    HPI [name] presents for   Diabetes - no hypoglycemic events. No wounds or sores that are not healing well. No increased thirst or urination. [name]. Taking medications as prescribed without any side effects. He [name]     Past Medical History:  Diagnosis Date  . Atrial fibrillation ([name])   . Diabetes mellitus without complication ([name])   . Hyperlipidemia   . Hypertension     Past Surgical History:  Procedure Laterality Date  . cyst removal from ankle    . finger reattachment    . VASECTOMY      Family History  Problem Relation Age of Onset  . Diabetes Mother   . Heart attack Father   . Stomach cancer Maternal Grandfather   . Colon cancer Neg Hx   . Esophageal cancer Neg Hx   . Rectal cancer Neg Hx     Social History   Socioeconomic History  . Marital status: Married    Spouse name: [name]  . Number of children: Not on file  . Years of education: Not on file  . Highest education level: Not on file  Occupational History  . Occupation: Pastor  Tobacco Use  . Smoking status: Current Some Day Smoker    Types: Pipe, Cigars  . Smokeless tobacco: Never Used  Substance and Sexual Activity  . Alcohol use: Yes    Comment: social  . Drug use: Never  . Sexual activity: Yes    Partners: Female  Other Topics Concern  . Not on file  Social History Narrative  . Not on file   Social Determinants of Health   Financial Resource Strain:   . Difficulty of Paying Living Expenses:   Food Insecurity:   . Worried About Charity fundraiser in the Last Year:   . Arboriculturist in the Last Year:   Transportation Needs:   . Film/video editor (Medical):   Marland Kitchen Lack of Transportation (Non-Medical):   Physical  Activity:   . Days of Exercise per Week:   . Minutes of Exercise per Session:   Stress:   . Feeling of Stress :   Social Connections:   . Frequency of Communication with Friends and Family:   . Frequency of Social Gatherings with Friends and Family:   . Attends Religious Services:   . Active Member of Clubs or Organizations:   . Attends Archivist Meetings:   Marland Kitchen Marital Status:   Intimate Partner Violence:   . Fear of Current or Ex-Partner:   . Emotionally Abused:   Marland Kitchen Physically Abused:   . Sexually Abused:     Outpatient Medications Prior to Visit  Medication Sig Dispense Refill  . glucose blood (ACCU-CHEK AVIVA PLUS) test strip Use to check blood sugars twice a week.    Marland Kitchen aspirin EC 81 MG tablet Take 81 mg by mouth daily.    Marland Kitchen lisinopril (ZESTRIL) 2.5 MG tablet TAKE 1 TABLET BY MOUTH EVERY DAY 90 tablet 0  . pravastatin (PRAVACHOL) 20 MG tablet Take 1 tablet (20 mg total) by mouth daily. 90 tablet 3  . XIGDUO XR 02-999 MG TB24 TAKE 1 TABLET BY MOUTH EVERY DAY 30 tablet 4  .  TRULICITY A999333 0000000 SOPN INJECT 0.75 MG INTO THE SKIN EVERY 7 (SEVEN) DAYS. 2 pen 0   No facility-administered medications prior to visit.    No Known Allergies  ROS Review of Systems    Objective:    Physical Exam  Constitutional: He is oriented to person, place, and time. He appears well-developed and well-nourished.  HENT:  Head: Normocephalic and atraumatic.  Eyes: Conjunctivae are normal.  Cardiovascular: Normal rate, regular rhythm and normal heart sounds.  Pulmonary/Chest: Effort normal and breath sounds normal.  Neurological: He is alert and oriented to person, place, and time.  Skin: Skin is warm and dry.  Psychiatric: He has a normal mood and affect. His behavior is normal.    BP 130/71   Pulse 96   Ht 5\' 9"  (1.753 m)   Wt 209 lb (94.8 kg)   SpO2 97%   BMI 30.86 kg/m  Wt Readings from Last 3 Encounters:  09/04/19 209 lb (94.8 kg)  05/04/19 204 lb (92.5 kg)   01/02/19 209 lb (94.8 kg)     Health Maintenance Due  Topic Date Due  . HIV Screening  Never done    There are no preventive care reminders to display for this patient.  No results found for: TSH Lab Results  Component Value Date   WBC 11.2 (H) 03/24/2011   HGB 16.8 03/24/2011   HCT 46.2 03/24/2011   MCV 87.0 03/24/2011   PLT 161 03/24/2011   Lab Results  Component Value Date   NA 139 11/02/2018   K 4.7 11/02/2018   CO2 26 11/02/2018   GLUCOSE 172 (H) 11/02/2018   BUN 17 11/02/2018   CREATININE 0.96 11/02/2018   BILITOT 0.6 11/02/2018   ALKPHOS 55 01/17/2009   AST 13 11/02/2018   ALT 18 11/02/2018   PROT 6.5 11/02/2018   ALBUMIN 4.3 01/17/2009   CALCIUM 9.5 11/02/2018   Lab Results  Component Value Date   CHOL 161 11/02/2018   Lab Results  Component Value Date   HDL 35 (L) 11/02/2018   Lab Results  Component Value Date   LDLCALC 93 11/02/2018   Lab Results  Component Value Date   TRIG 239 (H) 11/02/2018   Lab Results  Component Value Date   CHOLHDL 4.6 11/02/2018   Lab Results  Component Value Date   HGBA1C 7.1 (A) 09/04/2019      Assessment & Plan:   Problem List Items Addressed This Visit      Endocrine   Hyperlipidemia associated with type 2 diabetes mellitus ([place])    Tolerating statin well.        Relevant Medications   Dulaglutide (TRULICITY) 1.5 0000000 SOPN   Controlled diabetes mellitus type 2 with complications ([place]) - Primary    Uncontrolled.  A1c up to 7.1 today.  Discussed getting back on track he plans to start walking for exercise again he feels like he is actually doing pretty good with his diet but he has gained some weight.  We will increase Trulicity to 1.5 mg.  Plan to follow-up a little bit closer in 3 months. Encouraged him to schedule an eye exam.        Relevant Medications   Dulaglutide (TRULICITY) 1.5 0000000 SOPN   Other Relevant Orders   POCT glycosylated hemoglobin (Hb A1C) (Completed)   BASIC METABOLIC  PANEL WITH GFR    Other Visit Diagnoses    Need for tetanus, diphtheria, and acellular pertussis (Tdap) vaccine in patient of adolescent age or  older       Relevant Orders   Tdap vaccine greater than or equal to 7yo IM (Completed)   Need for Zostavax administration       Relevant Orders   Varicella-zoster vaccine IM (Shingrix) (Completed)      Meds ordered this encounter  Medications  . Dulaglutide (TRULICITY) 1.5 0000000 SOPN    Sig: Inject 1.5 mg into the skin once a week.    Dispense:  4 pen    Refill:  5    Follow-up: Return in about 3 months (around 12/04/2019) for Hypertension, Diabetes follow-up.    Beatrice Lecher, MD

## 2019-09-04 NOTE — Assessment & Plan Note (Addendum)
Uncontrolled.  A1c up to 7.1 today.  Discussed getting back on track he plans to start walking for exercise again he feels like he is actually doing pretty good with his diet but he has gained some weight.  We will increase Trulicity to 1.5 mg.  Plan to follow-up a little bit closer in 3 months. Encouraged him to schedule an eye exam.

## 2019-09-04 NOTE — Assessment & Plan Note (Signed)
Tolerating statin well.  

## 2019-09-05 NOTE — Progress Notes (Signed)
All labs are normal. 

## 2019-09-07 ENCOUNTER — Other Ambulatory Visit: Payer: Self-pay | Admitting: Family Medicine

## 2019-09-16 ENCOUNTER — Other Ambulatory Visit: Payer: Self-pay | Admitting: Family Medicine

## 2019-10-01 ENCOUNTER — Other Ambulatory Visit: Payer: Self-pay | Admitting: Family Medicine

## 2019-11-06 ENCOUNTER — Other Ambulatory Visit: Payer: Self-pay

## 2019-11-06 ENCOUNTER — Encounter: Payer: Self-pay | Admitting: Family Medicine

## 2019-11-06 ENCOUNTER — Ambulatory Visit (INDEPENDENT_AMBULATORY_CARE_PROVIDER_SITE_OTHER): Payer: Federal, State, Local not specified - PPO | Admitting: Family Medicine

## 2019-11-06 DIAGNOSIS — Z23 Encounter for immunization: Secondary | ICD-10-CM | POA: Diagnosis not present

## 2019-11-06 NOTE — Progress Notes (Signed)
Agree with documentation as above.   Johan Antonacci, MD  

## 2019-11-06 NOTE — Progress Notes (Signed)
Pt here for 2nd shingrix vaccine. He tolerated well.

## 2019-12-05 ENCOUNTER — Encounter: Payer: Self-pay | Admitting: Family Medicine

## 2019-12-05 ENCOUNTER — Other Ambulatory Visit: Payer: Self-pay

## 2019-12-05 ENCOUNTER — Ambulatory Visit: Payer: Federal, State, Local not specified - PPO | Admitting: Family Medicine

## 2019-12-05 VITALS — BP 126/78 | HR 96 | Ht 69.0 in | Wt 210.0 lb

## 2019-12-05 DIAGNOSIS — R030 Elevated blood-pressure reading, without diagnosis of hypertension: Secondary | ICD-10-CM | POA: Diagnosis not present

## 2019-12-05 DIAGNOSIS — E785 Hyperlipidemia, unspecified: Secondary | ICD-10-CM

## 2019-12-05 DIAGNOSIS — E1169 Type 2 diabetes mellitus with other specified complication: Secondary | ICD-10-CM

## 2019-12-05 DIAGNOSIS — E118 Type 2 diabetes mellitus with unspecified complications: Secondary | ICD-10-CM | POA: Diagnosis not present

## 2019-12-05 LAB — POCT GLYCOSYLATED HEMOGLOBIN (HGB A1C): Hemoglobin A1C: 6.6 % — AB (ref 4.0–5.6)

## 2019-12-05 NOTE — Assessment & Plan Note (Signed)
A1C looks much better this morning.  He is in a fantastic job in bringing it back down tolerating the Trulicity well.  Could space have been affordable for them to as well.  He is currently on an ACE inhibitor for renal protection as well as pravastatin.  Follow-up in 4 months.

## 2019-12-05 NOTE — Progress Notes (Signed)
Established Patient Office Visit  Subjective:  Patient ID: Benjamin Wright, male    DOB: 1961/04/09  Age: 59 y.o. MRN: [id number]  CC:  Chief Complaint  Patient presents with  . Diabetes    HPI Benjamin Wright presents for   Diabetes - no hypoglycemic events. No wounds or sores that are not healing well. No increased thirst or urination. [name]. Taking medications as prescribed without any side effects.  Hyperlipidemia - tolerating stating well with no myalgias or significant side effects.  Lab Results  Component Value Date   CHOL 161 11/02/2018   HDL 35 (L) 11/02/2018   LDLCALC 93 11/02/2018   TRIG 239 (H) 11/02/2018   CHOLHDL 4.6 11/02/2018       Past Medical History:  Diagnosis Date  . Atrial fibrillation ([place])   . Diabetes mellitus without complication ([place])   . Hyperlipidemia   . Hypertension     Past Surgical History:  Procedure Laterality Date  . cyst removal from ankle    . finger reattachment    . VASECTOMY      Family History  Problem Relation Age of Onset  . Diabetes Mother   . Heart attack Father   . Stomach cancer Maternal Grandfather   . Colon cancer Neg Hx   . Esophageal cancer Neg Hx   . Rectal cancer Neg Hx     Social History   Socioeconomic History  . Marital status: Married    Spouse name: [name]  . Number of children: Not on file  . Years of education: Not on file  . Highest education level: Not on file  Occupational History  . Occupation: Pastor  Tobacco Use  . Smoking status: Current Some Day Smoker    Types: Pipe, Cigars  . Smokeless tobacco: Never Used  Vaping Use  . Vaping Use: Never used  Substance and Sexual Activity  . Alcohol use: Yes    Comment: social  . Drug use: Never  . Sexual activity: Yes    Partners: Female  Other Topics Concern  . Not on file  Social History Narrative  . Not on file   Social Determinants of Health   Financial Resource Strain:   . Difficulty of Paying Living  Expenses:   Food Insecurity:   . Worried About Charity fundraiser in the Last Year:   . Arboriculturist in the Last Year:   Transportation Needs:   . Film/video editor (Medical):   Marland Kitchen Lack of Transportation (Non-Medical):   Physical Activity:   . Days of Exercise per Week:   . Minutes of Exercise per Session:   Stress:   . Feeling of Stress :   Social Connections:   . Frequency of Communication with Friends and Family:   . Frequency of Social Gatherings with Friends and Family:   . Attends Religious Services:   . Active Member of Clubs or Organizations:   . Attends Archivist Meetings:   Marland Kitchen Marital Status:   Intimate Partner Violence:   . Fear of Current or Ex-Partner:   . Emotionally Abused:   Marland Kitchen Physically Abused:   . Sexually Abused:     Outpatient Medications Prior to Visit  Medication Sig Dispense Refill  . aspirin EC 81 MG tablet Take 81 mg by mouth daily.    . Dulaglutide (TRULICITY) 1.5 MK/3.4JZ SOPN Inject 1.5 mg into the skin once a week. 4 pen 5  . glucose blood (ACCU-CHEK  AVIVA PLUS) test strip Use to check blood sugars twice a week.    Marland Kitchen lisinopril (ZESTRIL) 2.5 MG tablet TAKE 1 TABLET BY MOUTH EVERY DAY 90 tablet 0  . pravastatin (PRAVACHOL) 20 MG tablet Take 1 tablet (20 mg total) by mouth daily. 90 tablet 3  . XIGDUO XR 02-999 MG TB24 TAKE 1 TABLET BY MOUTH EVERY DAY 30 tablet 4   No facility-administered medications prior to visit.    No Known Allergies  ROS Review of Systems    Objective:    Physical Exam Constitutional:      Appearance: He is well-developed.  HENT:     Head: Normocephalic and atraumatic.  Cardiovascular:     Rate and Rhythm: Normal rate and regular rhythm.     Heart sounds: Normal heart sounds.  Pulmonary:     Effort: Pulmonary effort is normal.     Breath sounds: Normal breath sounds.  Skin:    General: Skin is warm and dry.  Neurological:     Mental Status: He is alert and oriented to person, place, and  time.  Psychiatric:        Behavior: Behavior normal.     BP 126/78   Pulse 96   Ht 5\' 9"  (1.753 m)   Wt 210 lb (95.3 kg)   SpO2 96%   BMI 31.01 kg/m  Wt Readings from Last 3 Encounters:  12/05/19 210 lb (95.3 kg)  09/04/19 209 lb (94.8 kg)  05/04/19 204 lb (92.5 kg)     Health Maintenance Due  Topic Date Due  . Hepatitis C Screening  Never done  . HIV Screening  Never done    There are no preventive care reminders to display for this patient.  No results found for: TSH Lab Results  Component Value Date   WBC 11.2 (H) 03/24/2011   HGB 16.8 03/24/2011   HCT 46.2 03/24/2011   MCV 87.0 03/24/2011   PLT 161 03/24/2011   Lab Results  Component Value Date   NA 139 09/04/2019   K 4.3 09/04/2019   CO2 27 09/04/2019   GLUCOSE 189 (H) 09/04/2019   BUN 16 09/04/2019   CREATININE 1.05 09/04/2019   BILITOT 0.6 11/02/2018   ALKPHOS 55 01/17/2009   AST 13 11/02/2018   ALT 18 11/02/2018   PROT 6.5 11/02/2018   ALBUMIN 4.3 01/17/2009   CALCIUM 9.9 09/04/2019   Lab Results  Component Value Date   CHOL 161 11/02/2018   Lab Results  Component Value Date   HDL 35 (L) 11/02/2018   Lab Results  Component Value Date   LDLCALC 93 11/02/2018   Lab Results  Component Value Date   TRIG 239 (H) 11/02/2018   Lab Results  Component Value Date   CHOLHDL 4.6 11/02/2018   Lab Results  Component Value Date   HGBA1C 6.6 (A) 12/05/2019      Assessment & Plan:   Problem List Items Addressed This Visit      Endocrine   Hyperlipidemia associated with type 2 diabetes mellitus ([place])    Currently on a statin and tolerating well.  Due for repeat lipids.      Relevant Orders   Lipid Panel w/reflex Direct LDL   BASIC METABOLIC PANEL WITH GFR   Controlled diabetes mellitus type 2 with complications (HCC) - Primary    A1C looks much better this morning.  He is in a fantastic job in bringing it back down tolerating the Trulicity well.  Could space have  been affordable for  them to as well.  He is currently on an ACE inhibitor for renal protection as well as pravastatin.  Follow-up in 4 months.      Relevant Orders   POCT glycosylated hemoglobin (Hb A1C) (Completed)   Lipid Panel w/reflex Direct LDL   BASIC METABOLIC PANEL WITH GFR    Other Visit Diagnoses    Elevated BP without diagnosis of hypertension          Repeat blood pressure looks phenomenal.  No orders of the defined types were placed in this encounter.   Follow-up: Return in about 3 months (around 03/06/2020) for Diabetes follow-up.      Beatrice Lecher, MD

## 2019-12-05 NOTE — Assessment & Plan Note (Signed)
Currently on a statin and tolerating well.  Due for repeat lipids.

## 2019-12-07 LAB — HM DIABETES EYE EXAM

## 2019-12-14 ENCOUNTER — Encounter: Payer: Self-pay | Admitting: Family Medicine

## 2019-12-16 ENCOUNTER — Other Ambulatory Visit: Payer: Self-pay | Admitting: Family Medicine

## 2020-01-01 ENCOUNTER — Other Ambulatory Visit: Payer: Self-pay | Admitting: Family Medicine

## 2020-01-01 LAB — LIPID PANEL W/REFLEX DIRECT LDL
Cholesterol: 141 mg/dL (ref <200)
HDL: 29 mg/dL — ABNORMAL LOW (ref >=40)
LDL Cholesterol (Calc): 82 mg/dL (calc)
Non-HDL Cholesterol (Calc): 112 mg/dL (calc) (ref <130)
Total CHOL/HDL Ratio: 4.9 (calc) (ref <5.0)
Triglycerides: 207 mg/dL — ABNORMAL HIGH (ref <150)

## 2020-01-01 LAB — BASIC METABOLIC PANEL WITH GFR
BUN: 19 mg/dL (ref 7–25)
CO2: 27 mmol/L (ref 20–32)
Calcium: 9.1 mg/dL (ref 8.6–10.3)
Chloride: 106 mmol/L (ref 98–110)
Creat: 0.97 mg/dL (ref 0.70–1.25)
GFR, Est African American: 97 mL/min/{1.73_m2} (ref >=60)
GFR, Est Non African American: 84 mL/min/{1.73_m2} (ref >=60)
Glucose, Bld: 120 mg/dL — ABNORMAL HIGH (ref 65–99)
Potassium: 4.2 mmol/L (ref 3.5–5.3)
Sodium: 142 mmol/L (ref 135–146)

## 2020-02-11 ENCOUNTER — Other Ambulatory Visit: Payer: Self-pay | Admitting: Family Medicine

## 2020-03-06 ENCOUNTER — Encounter: Payer: Self-pay | Admitting: Family Medicine

## 2020-03-06 ENCOUNTER — Ambulatory Visit (INDEPENDENT_AMBULATORY_CARE_PROVIDER_SITE_OTHER): Payer: Federal, State, Local not specified - PPO | Admitting: Family Medicine

## 2020-03-06 VITALS — BP 135/80 | HR 79 | Ht 69.0 in | Wt 210.0 lb

## 2020-03-06 DIAGNOSIS — Z6831 Body mass index (BMI) 31.0-31.9, adult: Secondary | ICD-10-CM | POA: Diagnosis not present

## 2020-03-06 DIAGNOSIS — Z23 Encounter for immunization: Secondary | ICD-10-CM

## 2020-03-06 DIAGNOSIS — R809 Proteinuria, unspecified: Secondary | ICD-10-CM

## 2020-03-06 DIAGNOSIS — E1129 Type 2 diabetes mellitus with other diabetic kidney complication: Secondary | ICD-10-CM

## 2020-03-06 DIAGNOSIS — E118 Type 2 diabetes mellitus with unspecified complications: Secondary | ICD-10-CM

## 2020-03-06 DIAGNOSIS — Z6829 Body mass index (BMI) 29.0-29.9, adult: Secondary | ICD-10-CM | POA: Insufficient documentation

## 2020-03-06 DIAGNOSIS — Z6828 Body mass index (BMI) 28.0-28.9, adult: Secondary | ICD-10-CM | POA: Insufficient documentation

## 2020-03-06 LAB — POCT GLYCOSYLATED HEMOGLOBIN (HGB A1C): Hemoglobin A1C: 7.1 % — AB (ref 4.0–5.6)

## 2020-03-06 MED ORDER — PRAVASTATIN SODIUM 20 MG PO TABS: 20.0000 mg | ORAL_TABLET | Freq: Every day | ORAL | 3 refills | Status: DC

## 2020-03-06 MED ORDER — LISINOPRIL 2.5 MG PO TABS: 2.5000 mg | ORAL_TABLET | Freq: Every day | ORAL | 1 refills | Status: DC

## 2020-03-06 MED ORDER — XIGDUO XR 10-1000 MG PO TB24: 1.0000 | ORAL_TABLET | Freq: Every day | ORAL | 1 refills | Status: DC

## 2020-03-06 MED ORDER — TRULICITY 1.5 MG/0.5ML ~~LOC~~ SOAJ: 1.5000 mg | SUBCUTANEOUS | 1 refills | Status: DC

## 2020-03-06 NOTE — Addendum Note (Signed)
Addended by: Beatrice Lecher D on: 03/06/2020 09:26 PM   Modules accepted: Orders

## 2020-03-06 NOTE — Assessment & Plan Note (Signed)
Discussed that I really would like to see him lose about 20 pounds and get down to about 190 pounds I think it would make a big difference in his blood sugars as well as continuing to control his blood pressures.  Would really like to see if systolic less than 924.  Encouraged him to continue to work at it and make those changes.  He feels motivated.  Follow back up in 3 months

## 2020-03-06 NOTE — Assessment & Plan Note (Signed)
Uncontrolled.  Hemoglobin A1c back up to 7.1 it looked fantastic 3 months ago at 6.6.  He does not feel like he has had any major changes in his diet or exercise.  He is taking his medications consistently.  Encouraged him to work on weight loss and getting back into the gym.  And continue to working on portion control with carbs.

## 2020-03-06 NOTE — Progress Notes (Addendum)
Established Patient Office Visit  Subjective:  Patient ID: Benjamin Wright, male    DOB: 11-25-1960  Age: 59 y.o. MRN: [id number]  CC:  Chief Complaint  Patient presents with  . Diabetes    HPI Benjamin Wright presents for   Diabetes - no hypoglycemic events. No wounds or sores that are not healing well. No increased thirst or urination. [name]. Taking medications as prescribed without any side effects.  He has joined the gym recently but unfortunately has not been able to go because they started to mandate masks again and says he really just cannot breathe well but he is hoping to start to work out again.   Past Medical History:  Diagnosis Date  . Atrial fibrillation ([place])   . Diabetes mellitus without complication ([place])   . Hyperlipidemia   . Hypertension     Past Surgical History:  Procedure Laterality Date  . cyst removal from ankle    . finger reattachment    . VASECTOMY      Family History  Problem Relation Age of Onset  . Diabetes Mother   . Heart attack Father   . Stomach cancer Maternal Grandfather   . Colon cancer Neg Hx   . Esophageal cancer Neg Hx   . Rectal cancer Neg Hx     Social History   Socioeconomic History  . Marital status: Married    Spouse name: Benjamin Wright  . Number of children: Not on file  . Years of education: Not on file  . Highest education level: Not on file  Occupational History  . Occupation: Pastor  Tobacco Use  . Smoking status: Current Some Day Smoker    Types: Pipe, Cigars  . Smokeless tobacco: Never Used  Vaping Use  . Vaping Use: Never used  Substance and Sexual Activity  . Alcohol use: Yes    Comment: social  . Drug use: Never  . Sexual activity: Yes    Partners: Female  Other Topics Concern  . Not on file  Social History Narrative  . Not on file   Social Determinants of Health   Financial Resource Strain:   . Difficulty of Paying Living Expenses: Not on file  Food Insecurity:   .  Worried About Charity fundraiser in the Last Year: Not on file  . Ran Out of Food in the Last Year: Not on file  Transportation Needs:   . Lack of Transportation (Medical): Not on file  . Lack of Transportation (Non-Medical): Not on file  Physical Activity:   . Days of Exercise per Week: Not on file  . Minutes of Exercise per Session: Not on file  Stress:   . Feeling of Stress : Not on file  Social Connections:   . Frequency of Communication with Friends and Family: Not on file  . Frequency of Social Gatherings with Friends and Family: Not on file  . Attends Religious Services: Not on file  . Active Member of Clubs or Organizations: Not on file  . Attends Archivist Meetings: Not on file  . Marital Status: Not on file  Intimate Partner Violence:   . Fear of Current or Ex-Partner: Not on file  . Emotionally Abused: Not on file  . Physically Abused: Not on file  . Sexually Abused: Not on file    Outpatient Medications Prior to Visit  Medication Sig Dispense Refill  . aspirin EC 81 MG tablet Take 81 mg by mouth daily.    Marland Kitchen  glucose blood (ACCU-CHEK AVIVA PLUS) test strip Use to check blood sugars twice a week.    . Dulaglutide (TRULICITY) 1.5 DD/2.2GU SOPN Inject 1.5 mg into the skin once a week. 4 pen 5  . lisinopril (ZESTRIL) 2.5 MG tablet TAKE 1 TABLET BY MOUTH EVERY DAY 90 tablet 0  . pravastatin (PRAVACHOL) 20 MG tablet Take 1 tablet (20 mg total) by mouth daily. Needs labs 90 tablet 0  . XIGDUO XR 02-999 MG TB24 TAKE 1 TABLET BY MOUTH EVERY DAY 30 tablet 0   No facility-administered medications prior to visit.    No Known Allergies  ROS Review of Systems    Objective:    Physical Exam Constitutional:      Appearance: He is well-developed.  HENT:     Head: Normocephalic and atraumatic.  Cardiovascular:     Rate and Rhythm: Normal rate and regular rhythm.     Heart sounds: Normal heart sounds.  Pulmonary:     Effort: Pulmonary effort is normal.      Breath sounds: Normal breath sounds.  Skin:    General: Skin is warm and dry.  Neurological:     Mental Status: He is alert and oriented to person, place, and time.  Psychiatric:        Behavior: Behavior normal.     BP 135/80   Pulse 79   Ht 5\' 9"  (1.753 m)   Wt 210 lb (95.3 kg)   SpO2 98%   BMI 31.01 kg/m  Wt Readings from Last 3 Encounters:  03/06/20 210 lb (95.3 kg)  12/05/19 210 lb (95.3 kg)  09/04/19 209 lb (94.8 kg)     Health Maintenance Due  Topic Date Due  . Hepatitis C Screening  Never done  . HIV Screening  Never done    There are no preventive care reminders to display for this patient.  No results found for: TSH Lab Results  Component Value Date   WBC 11.2 (H) 03/24/2011   HGB 16.8 03/24/2011   HCT 46.2 03/24/2011   MCV 87.0 03/24/2011   PLT 161 03/24/2011   Lab Results  Component Value Date   NA 142 01/01/2020   K 4.2 01/01/2020   CO2 27 01/01/2020   GLUCOSE 120 (H) 01/01/2020   BUN 19 01/01/2020   CREATININE 0.97 01/01/2020   BILITOT 0.6 11/02/2018   ALKPHOS 55 01/17/2009   AST 13 11/02/2018   ALT 18 11/02/2018   PROT 6.5 11/02/2018   ALBUMIN 4.3 01/17/2009   CALCIUM 9.1 01/01/2020   Lab Results  Component Value Date   CHOL 141 01/01/2020   Lab Results  Component Value Date   HDL 29 (L) 01/01/2020   Lab Results  Component Value Date   LDLCALC 82 01/01/2020   Lab Results  Component Value Date   TRIG 207 (H) 01/01/2020   Lab Results  Component Value Date   CHOLHDL 4.9 01/01/2020   Lab Results  Component Value Date   HGBA1C 7.1 (A) 03/06/2020      Assessment & Plan:   Problem List Items Addressed This Visit      Endocrine   Microalbuminuria due to type 2 diabetes mellitus (HCC)   Relevant Medications   Dulaglutide (TRULICITY) 1.5 RK/2.7CW SOPN   pravastatin (PRAVACHOL) 20 MG tablet   Dapagliflozin-metFORMIN HCl ER (XIGDUO XR) 02-999 MG TB24   lisinopril (ZESTRIL) 2.5 MG tablet   Controlled diabetes mellitus  type 2 with complications ([place]) - Primary    Uncontrolled.  Hemoglobin A1c back up to 7.1 it looked fantastic 3 months ago at 6.6.  He does not feel like he has had any major changes in his diet or exercise.  He is taking his medications consistently.  Encouraged him to work on weight loss and getting back into the gym.  And continue to working on portion control with carbs.      Relevant Medications   Dulaglutide (TRULICITY) 1.5 FU/9.3AT SOPN   pravastatin (PRAVACHOL) 20 MG tablet   Dapagliflozin-metFORMIN HCl ER (XIGDUO XR) 02-999 MG TB24   lisinopril (ZESTRIL) 2.5 MG tablet   Other Relevant Orders   POCT glycosylated hemoglobin (Hb A1C) (Completed)     Other   BMI 31.0-31.9,adult    Discussed that I really would like to see him lose about 20 pounds and get down to about 190 pounds I think it would make a big difference in his blood sugars as well as continuing to control his blood pressures.  Would really like to see if systolic less than 557.  Encouraged him to continue to work at it and make those changes.  He feels motivated.  Follow back up in 3 months       Other Visit Diagnoses    Need for immunization against influenza       Relevant Orders   Flu Vaccine QUAD 36+ mos IM (Completed)      Meds ordered this encounter  Medications  . Dulaglutide (TRULICITY) 1.5 DU/2.0UR SOPN    Sig: Inject 1.5 mg into the skin once a week.    Dispense:  12 mL    Refill:  1  . pravastatin (PRAVACHOL) 20 MG tablet    Sig: Take 1 tablet (20 mg total) by mouth at bedtime. Needs labs    Dispense:  90 tablet    Refill:  3  . Dapagliflozin-metFORMIN HCl ER (XIGDUO XR) 02-999 MG TB24    Sig: Take 1 tablet by mouth daily.    Dispense:  90 tablet    Refill:  1  . lisinopril (ZESTRIL) 2.5 MG tablet    Sig: Take 1 tablet (2.5 mg total) by mouth daily.    Dispense:  90 tablet    Refill:  1    Follow-up: Return in about 3 months (around 06/13/2020) for Diabetes follow-up.    Beatrice Lecher, MD

## 2020-03-07 ENCOUNTER — Other Ambulatory Visit: Payer: Self-pay | Admitting: Family Medicine

## 2020-03-07 DIAGNOSIS — E118 Type 2 diabetes mellitus with unspecified complications: Secondary | ICD-10-CM

## 2020-03-10 ENCOUNTER — Other Ambulatory Visit: Payer: Self-pay | Admitting: Family Medicine

## 2020-03-10 DIAGNOSIS — E118 Type 2 diabetes mellitus with unspecified complications: Secondary | ICD-10-CM

## 2020-06-13 ENCOUNTER — Other Ambulatory Visit: Payer: Self-pay

## 2020-06-13 ENCOUNTER — Ambulatory Visit: Payer: Federal, State, Local not specified - PPO | Admitting: Family Medicine

## 2020-06-13 ENCOUNTER — Encounter: Payer: Self-pay | Admitting: Family Medicine

## 2020-06-13 ENCOUNTER — Telehealth: Payer: Self-pay

## 2020-06-13 VITALS — BP 117/69 | HR 83 | Ht 69.0 in | Wt 215.0 lb

## 2020-06-13 DIAGNOSIS — E1169 Type 2 diabetes mellitus with other specified complication: Secondary | ICD-10-CM

## 2020-06-13 DIAGNOSIS — E1129 Type 2 diabetes mellitus with other diabetic kidney complication: Secondary | ICD-10-CM

## 2020-06-13 DIAGNOSIS — Z1159 Encounter for screening for other viral diseases: Secondary | ICD-10-CM | POA: Diagnosis not present

## 2020-06-13 DIAGNOSIS — E118 Type 2 diabetes mellitus with unspecified complications: Secondary | ICD-10-CM

## 2020-06-13 DIAGNOSIS — R809 Proteinuria, unspecified: Secondary | ICD-10-CM

## 2020-06-13 DIAGNOSIS — E785 Hyperlipidemia, unspecified: Secondary | ICD-10-CM

## 2020-06-13 DIAGNOSIS — Z6831 Body mass index (BMI) 31.0-31.9, adult: Secondary | ICD-10-CM

## 2020-06-13 LAB — POCT GLYCOSYLATED HEMOGLOBIN (HGB A1C): Hemoglobin A1C: 7 % — AB (ref 4.0–5.6)

## 2020-06-13 NOTE — Assessment & Plan Note (Signed)
Continue ACE inhibitor 

## 2020-06-13 NOTE — Assessment & Plan Note (Signed)
A1c 7.0 today still elevated but he is recently been much more motivated he has been exercising and is really started changing his diet after the holidays.  We discussed options including just continue to work on diet and exercise for an additional 3 months versus switching his Trulicity to Ozempic, versus adding long-acting insulin at bedtime.  For now he really wants to work on diet and exercise for the next 3 months to help see him then.

## 2020-06-13 NOTE — Telephone Encounter (Signed)
Prior authorization for Xigduo submitted to [employer]. Pending determination.

## 2020-06-13 NOTE — Progress Notes (Signed)
Established Patient Office Visit  Subjective:  Patient ID: Benjamin Wright, male    DOB: 06-22-1960  Age: 60 y.o. MRN: [id number]  CC:  Chief Complaint  Patient presents with  . Diabetes  . Hypertension  . Hyperlipidemia    HPI Benjamin Wright presents for   Diabetes - no hypoglycemic events. No wounds or sores that are not healing well. No increased thirst or urination. [name]. Taking medications as prescribed without any side effects. Has been exercising more regularly with his [place].  Trying to eat more healthy the last few week.  AM glucose 140-150s. No neuropathy sxs.    He has gained about 5 lbs since last hear.    Past Medical History:  Diagnosis Date  . Atrial fibrillation ([place])   . Diabetes mellitus without complication ([place])   . Hyperlipidemia   . Hypertension     Past Surgical History:  Procedure Laterality Date  . cyst removal from ankle    . finger reattachment    . VASECTOMY      Family History  Problem Relation Age of Onset  . Diabetes Mother   . Heart attack Father   . Stomach cancer Maternal Grandfather   . Colon cancer Neg Hx   . Esophageal cancer Neg Hx   . Rectal cancer Neg Hx     Social History   Socioeconomic History  . Marital status: Married    Spouse name: [name]  . Number of children: Not on file  . Years of education: Not on file  . Highest education level: Not on file  Occupational History  . Occupation: Pastor  Tobacco Use  . Smoking status: Current Some Day Smoker    Types: Pipe, Cigars  . Smokeless tobacco: Never Used  Vaping Use  . Vaping Use: Never used  Substance and Sexual Activity  . Alcohol use: Yes    Comment: social  . Drug use: Never  . Sexual activity: Yes    Partners: Female  Other Topics Concern  . Not on file  Social History Narrative  . Not on file   Social Determinants of Health   Financial Resource Strain: Not on file  Food Insecurity: Not on file  Transportation Needs:  Not on file  Physical Activity: Not on file  Stress: Not on file  Social Connections: Not on file  Intimate Partner Violence: Not on file    Outpatient Medications Prior to Visit  Medication Sig Dispense Refill  . aspirin EC 81 MG tablet Take 81 mg by mouth daily.    . Dapagliflozin-metFORMIN HCl ER (XIGDUO XR) 02-999 MG TB24 Take 1 tablet by mouth daily. 90 tablet 1  . Dulaglutide (TRULICITY) 1.5 WU/9.8JX SOPN Inject 1.5 mg into the skin once a week. 12 mL 1  . glucose blood (ACCU-CHEK AVIVA PLUS) test strip Use to check blood sugars twice a week.    Marland Kitchen lisinopril (ZESTRIL) 2.5 MG tablet Take 1 tablet (2.5 mg total) by mouth daily. 90 tablet 1  . pravastatin (PRAVACHOL) 20 MG tablet Take 1 tablet (20 mg total) by mouth at bedtime. Needs labs 90 tablet 3  . XIAFLEX 0.9 MG SOLR 0.9 mg every 30 (thirty) days.     No facility-administered medications prior to visit.    No Known Allergies  ROS Review of Systems    Objective:    Physical Exam Constitutional:      Appearance: He is well-developed and well-nourished.  HENT:  Head: Normocephalic and atraumatic.  Cardiovascular:     Rate and Rhythm: Normal rate and regular rhythm.     Heart sounds: Normal heart sounds.  Pulmonary:     Effort: Pulmonary effort is normal.     Breath sounds: Normal breath sounds.  Skin:    General: Skin is warm and dry.  Neurological:     Mental Status: He is alert and oriented to person, place, and time.  Psychiatric:        Mood and Affect: Mood and affect normal.        Behavior: Behavior normal.     BP 117/69   Pulse 83   Ht 5\' 9"  (1.753 m)   Wt 215 lb (97.5 kg)   SpO2 97%   BMI 31.75 kg/m  Wt Readings from Last 3 Encounters:  06/13/20 215 lb (97.5 kg)  03/06/20 210 lb (95.3 kg)  12/05/19 210 lb (95.3 kg)     Health Maintenance Due  Topic Date Due  . Hepatitis C Screening  Never done  . HIV Screening  Never done    There are no preventive care reminders to display for  this patient.  No results found for: TSH Lab Results  Component Value Date   WBC 11.2 (H) 03/24/2011   HGB 16.8 03/24/2011   HCT 46.2 03/24/2011   MCV 87.0 03/24/2011   PLT 161 03/24/2011   Lab Results  Component Value Date   NA 142 01/01/2020   K 4.2 01/01/2020   CO2 27 01/01/2020   GLUCOSE 120 (H) 01/01/2020   BUN 19 01/01/2020   CREATININE 0.97 01/01/2020   BILITOT 0.6 11/02/2018   ALKPHOS 55 01/17/2009   AST 13 11/02/2018   ALT 18 11/02/2018   PROT 6.5 11/02/2018   ALBUMIN 4.3 01/17/2009   CALCIUM 9.1 01/01/2020   Lab Results  Component Value Date   CHOL 141 01/01/2020   Lab Results  Component Value Date   HDL 29 (L) 01/01/2020   Lab Results  Component Value Date   LDLCALC 82 01/01/2020   Lab Results  Component Value Date   TRIG 207 (H) 01/01/2020   Lab Results  Component Value Date   CHOLHDL 4.9 01/01/2020   Lab Results  Component Value Date   HGBA1C 7.0 (A) 06/13/2020      Assessment & Plan:   Problem List Items Addressed This Visit      Endocrine   Microalbuminuria due to type 2 diabetes mellitus ([place])    Continue ACE inhibitor.      Hyperlipidemia associated with type 2 diabetes mellitus ([place])    Lipids are UTD.  Continue statin.       Controlled diabetes mellitus type 2 with complications (HCC) - Primary    A1c 7.0 today still elevated but he is recently been much more motivated he has been exercising and is really started changing his diet after the holidays.  We discussed options including just continue to work on diet and exercise for an additional 3 months versus switching his Trulicity to Ozempic, versus adding long-acting insulin at bedtime.  For now he really wants to work on diet and exercise for the next 3 months to help see him then.      Relevant Orders   BASIC METABOLIC PANEL WITH GFR   POCT glycosylated hemoglobin (Hb A1C) (Completed)     Other   BMI 31.0-31.9,adult    Today to work on healthy diet and regular exercise.   Work on [employer]  loss.  He gained about 5 pounds in the last saw him and encouraged him to work on trying to get that back off.       Other Visit Diagnoses    Encounter for hepatitis C screening test for low risk patient       Relevant Orders   Hepatitis C Antibody      No orders of the defined types were placed in this encounter.   Follow-up: Return in about 3 months (around 09/11/2020) for Diabetes follow-up.    Beatrice Lecher, MD

## 2020-06-13 NOTE — Assessment & Plan Note (Signed)
Today to work on healthy diet and regular exercise.  Work on weight loss.  He gained about 5 pounds in the last saw him and encouraged him to work on trying to get that back off.

## 2020-06-13 NOTE — Assessment & Plan Note (Signed)
Lipids are UTD.  Continue statin.

## 2020-06-14 LAB — BASIC METABOLIC PANEL WITH GFR
BUN: 21 mg/dL (ref 7–25)
CO2: 26 mmol/L (ref 20–32)
Calcium: 9.7 mg/dL (ref 8.6–10.3)
Chloride: 106 mmol/L (ref 98–110)
Creat: 1 mg/dL (ref 0.70–1.25)
GFR, Est African American: 94 mL/min/{1.73_m2} (ref >=60)
GFR, Est Non African American: 81 mL/min/{1.73_m2} (ref >=60)
Glucose, Bld: 159 mg/dL — ABNORMAL HIGH (ref 65–139)
Potassium: 4.6 mmol/L (ref 3.5–5.3)
Sodium: 140 mmol/L (ref 135–146)

## 2020-06-14 LAB — HEPATITIS C ANTIBODY
Hepatitis C Ab: NONREACTIVE
SIGNAL TO CUT-OFF: 0.01 (ref <1.00)

## 2020-06-14 NOTE — Progress Notes (Signed)
All labs are normal. 

## 2020-06-19 NOTE — Telephone Encounter (Signed)
Xigduo XR 10-1000mg  approved 05/14/20-06/13/21. Pharmacy notified.

## 2020-09-11 ENCOUNTER — Other Ambulatory Visit: Payer: Self-pay

## 2020-09-11 ENCOUNTER — Encounter: Payer: Self-pay | Admitting: Family Medicine

## 2020-09-11 ENCOUNTER — Ambulatory Visit: Payer: Federal, State, Local not specified - PPO | Admitting: Family Medicine

## 2020-09-11 VITALS — BP 136/82 | HR 74 | Ht 69.0 in | Wt 212.0 lb

## 2020-09-11 DIAGNOSIS — E1169 Type 2 diabetes mellitus with other specified complication: Secondary | ICD-10-CM | POA: Diagnosis not present

## 2020-09-11 DIAGNOSIS — R809 Proteinuria, unspecified: Secondary | ICD-10-CM

## 2020-09-11 DIAGNOSIS — E118 Type 2 diabetes mellitus with unspecified complications: Secondary | ICD-10-CM | POA: Diagnosis not present

## 2020-09-11 DIAGNOSIS — Z6831 Body mass index (BMI) 31.0-31.9, adult: Secondary | ICD-10-CM

## 2020-09-11 DIAGNOSIS — E1129 Type 2 diabetes mellitus with other diabetic kidney complication: Secondary | ICD-10-CM

## 2020-09-11 DIAGNOSIS — E785 Hyperlipidemia, unspecified: Secondary | ICD-10-CM

## 2020-09-11 LAB — POCT GLYCOSYLATED HEMOGLOBIN (HGB A1C): Hemoglobin A1C: 6.7 % — AB (ref 4.0–5.6)

## 2020-09-11 MED ORDER — LISINOPRIL 2.5 MG PO TABS: 2.5000 mg | ORAL_TABLET | Freq: Every day | ORAL | 1 refills | Status: DC

## 2020-09-11 MED ORDER — PRAVASTATIN SODIUM 20 MG PO TABS: 20.0000 mg | ORAL_TABLET | Freq: Every day | ORAL | 1 refills | Status: DC

## 2020-09-11 NOTE — Progress Notes (Signed)
Established Patient Office Visit  Subjective:  Patient ID: Benjamin Wright, male    DOB: 1960-08-17  Age: 60 y.o. MRN: [id number]  CC:  Chief Complaint  Patient presents with  . Diabetes    HPI QUY LOTTS presents for   Diabetes - no hypoglycemic events. No wounds or sores that are not healing well. No increased thirst or urination. [name]. Taking medications as prescribed without any side effects.  He has been exercising more since I last saw him bring down his sugars.  Has been taking his medications consistently.  Tolerating the Trulicity okay overall though he does get a little bit of nausea with it.  He is down 3 pounds since I last saw him.   Past Medical History:  Diagnosis Date  . Atrial fibrillation ([place])   . Diabetes mellitus without complication ([place])   . Hyperlipidemia   . Hypertension     Past Surgical History:  Procedure Laterality Date  . cyst removal from ankle    . finger reattachment    . VASECTOMY      Family History  Problem Relation Age of Onset  . Diabetes Mother   . Heart attack Father   . Stomach cancer Maternal Grandfather   . Colon cancer Neg Hx   . Esophageal cancer Neg Hx   . Rectal cancer Neg Hx     Social History   Socioeconomic History  . Marital status: Married    Spouse name: [name]  . Number of children: Not on file  . Years of education: Not on file  . Highest education level: Not on file  Occupational History  . Occupation: Pastor  Tobacco Use  . Smoking status: Current Some Day Smoker    Types: Pipe, Cigars  . Smokeless tobacco: Never Used  Vaping Use  . Vaping Use: Never used  Substance and Sexual Activity  . Alcohol use: Yes    Comment: social  . Drug use: Never  . Sexual activity: Yes    Partners: Female  Other Topics Concern  . Not on file  Social History Narrative  . Not on file   Social Determinants of Health   Financial Resource Strain: Not on file  Food Insecurity: Not on  file  Transportation Needs: Not on file  Physical Activity: Not on file  Stress: Not on file  Social Connections: Not on file  Intimate Partner Violence: Not on file    Outpatient Medications Prior to Visit  Medication Sig Dispense Refill  . aspirin EC 81 MG tablet Take 81 mg by mouth daily.    . Dapagliflozin-metFORMIN HCl ER (XIGDUO XR) 02-999 MG TB24 Take 1 tablet by mouth daily. 90 tablet 1  . Dulaglutide (TRULICITY) 1.5 XH/3.7JI SOPN Inject 1.5 mg into the skin once a week. 12 mL 1  . glucose blood (ACCU-CHEK AVIVA PLUS) test strip Use to check blood sugars twice a week.    Marland Kitchen lisinopril (ZESTRIL) 2.5 MG tablet Take 1 tablet (2.5 mg total) by mouth daily. 90 tablet 1  . pravastatin (PRAVACHOL) 20 MG tablet Take 1 tablet (20 mg total) by mouth at bedtime. Needs labs 90 tablet 3  . XIAFLEX 0.9 MG SOLR 0.9 mg every 30 (thirty) days.     No facility-administered medications prior to visit.    No Known Allergies  ROS Review of Systems    Objective:    Physical Exam Constitutional:      Appearance: He is well-developed.  HENT:  Head: Normocephalic and atraumatic.  Cardiovascular:     Rate and Rhythm: Normal rate and regular rhythm.     Heart sounds: Normal heart sounds.  Pulmonary:     Effort: Pulmonary effort is normal.     Breath sounds: Normal breath sounds.  Skin:    General: Skin is warm and dry.  Neurological:     Mental Status: He is alert and oriented to person, place, and time.  Psychiatric:        Behavior: Behavior normal.     BP 136/82   Pulse 74   Ht 5\' 9"  (9.622 m)   Wt 212 lb (96.2 kg)   SpO2 98%   BMI 31.31 kg/m  Wt Readings from Last 3 Encounters:  09/11/20 212 lb (96.2 kg)  06/13/20 215 lb (97.5 kg)  03/06/20 210 lb (95.3 kg)     Health Maintenance Due  Topic Date Due  . HIV Screening  Never done    There are no preventive care reminders to display for this patient.  No results found for: TSH Lab Results  Component Value Date    WBC 11.2 (H) 03/24/2011   HGB 16.8 03/24/2011   HCT 46.2 03/24/2011   MCV 87.0 03/24/2011   PLT 161 03/24/2011   Lab Results  Component Value Date   NA 140 06/13/2020   K 4.6 06/13/2020   CO2 26 06/13/2020   GLUCOSE 159 (H) 06/13/2020   BUN 21 06/13/2020   CREATININE 1.00 06/13/2020   BILITOT 0.6 11/02/2018   ALKPHOS 55 01/17/2009   AST 13 11/02/2018   ALT 18 11/02/2018   PROT 6.5 11/02/2018   ALBUMIN 4.3 01/17/2009   CALCIUM 9.7 06/13/2020   Lab Results  Component Value Date   CHOL 141 01/01/2020   Lab Results  Component Value Date   HDL 29 (L) 01/01/2020   Lab Results  Component Value Date   LDLCALC 82 01/01/2020   Lab Results  Component Value Date   TRIG 207 (H) 01/01/2020   Lab Results  Component Value Date   CHOLHDL 4.9 01/01/2020   Lab Results  Component Value Date   HGBA1C 6.7 (A) 09/11/2020      Assessment & Plan:   Problem List Items Addressed This Visit      Endocrine   Microalbuminuria due to type 2 diabetes mellitus ([place])    Continue low-dose lisinopril.      Relevant Medications   pravastatin (PRAVACHOL) 20 MG tablet   lisinopril (ZESTRIL) 2.5 MG tablet   Hyperlipidemia associated with type 2 diabetes mellitus ([place])    Tolerating statin well.  Due to recheck lipids at next visit.      Relevant Medications   pravastatin (PRAVACHOL) 20 MG tablet   lisinopril (ZESTRIL) 2.5 MG tablet   Controlled diabetes mellitus type 2 with complications ([facility]) - Primary    A1c is back down to 6.7 which is fantastic continue to work on portion control and keep up the regular exercise.  He is down 3 pounds which is great.  Continue low-dose ACE inhibitor for microalbuminuria as well as statin daily.  Follow-up in 3 to 4 months.      Relevant Medications   pravastatin (PRAVACHOL) 20 MG tablet   lisinopril (ZESTRIL) 2.5 MG tablet   Other Relevant Orders   POCT glycosylated hemoglobin (Hb A1C) (Completed)     Other   BMI 31.0-31.9,adult       Meds ordered this encounter  Medications  . pravastatin (PRAVACHOL) 20  MG tablet    Sig: Take 1 tablet (20 mg total) by mouth at bedtime.    Dispense:  90 tablet    Refill:  1  . lisinopril (ZESTRIL) 2.5 MG tablet    Sig: Take 1 tablet (2.5 mg total) by mouth daily.    Dispense:  90 tablet    Refill:  1    Follow-up: Return in about 3 months (around 12/23/2020) for Diabetes follow-up and labs.    Beatrice Lecher, MD

## 2020-09-11 NOTE — Assessment & Plan Note (Signed)
Tolerating statin well.  Due to recheck lipids at next visit.

## 2020-09-11 NOTE — Assessment & Plan Note (Signed)
Continue low-dose lisinopril.

## 2020-09-11 NOTE — Assessment & Plan Note (Signed)
A1c is back down to 6.7 which is fantastic continue to work on portion control and keep up the regular exercise.  He is down 3 pounds which is great.  Continue low-dose ACE inhibitor for microalbuminuria as well as statin daily.  Follow-up in 3 to 4 months.

## 2020-12-11 ENCOUNTER — Ambulatory Visit: Payer: Federal, State, Local not specified - PPO | Admitting: Family Medicine

## 2020-12-11 ENCOUNTER — Encounter: Payer: Self-pay | Admitting: Family Medicine

## 2020-12-11 ENCOUNTER — Other Ambulatory Visit: Payer: Self-pay

## 2020-12-11 VITALS — BP 131/87 | HR 86 | Ht 69.0 in | Wt 211.0 lb

## 2020-12-11 DIAGNOSIS — Z6831 Body mass index (BMI) 31.0-31.9, adult: Secondary | ICD-10-CM | POA: Diagnosis not present

## 2020-12-11 DIAGNOSIS — E785 Hyperlipidemia, unspecified: Secondary | ICD-10-CM

## 2020-12-11 DIAGNOSIS — E1169 Type 2 diabetes mellitus with other specified complication: Secondary | ICD-10-CM

## 2020-12-11 DIAGNOSIS — E118 Type 2 diabetes mellitus with unspecified complications: Secondary | ICD-10-CM | POA: Diagnosis not present

## 2020-12-11 LAB — CBC
HCT: 53.5 % — ABNORMAL HIGH (ref 38.5–50.0)
Hemoglobin: 17.9 g/dL — ABNORMAL HIGH (ref 13.2–17.1)
MCH: 30.4 pg (ref 27.0–33.0)
MCHC: 33.5 g/dL (ref 32.0–36.0)
MCV: 90.8 fL (ref 80.0–100.0)
MPV: 11.1 fL (ref 7.5–12.5)
Platelets: 169 10*3/uL (ref 140–400)
RBC: 5.89 10*6/uL — ABNORMAL HIGH (ref 4.20–5.80)
RDW: 13.1 % (ref 11.0–15.0)
WBC: 5.5 10*3/uL (ref 3.8–10.8)

## 2020-12-11 LAB — COMPLETE METABOLIC PANEL WITH GFR
AG Ratio: 2.1 (calc) (ref 1.0–2.5)
ALT: 23 U/L (ref 9–46)
AST: 18 U/L (ref 10–35)
Albumin: 4.6 g/dL (ref 3.6–5.1)
Alkaline phosphatase (APISO): 79 U/L (ref 35–144)
BUN: 17 mg/dL (ref 7–25)
CO2: 28 mmol/L (ref 20–32)
Calcium: 9.5 mg/dL (ref 8.6–10.3)
Chloride: 103 mmol/L (ref 98–110)
Creat: 1.06 mg/dL (ref 0.70–1.35)
Globulin: 2.2 g/dL (calc) (ref 1.9–3.7)
Glucose, Bld: 116 mg/dL — ABNORMAL HIGH (ref 65–99)
Potassium: 4.6 mmol/L (ref 3.5–5.3)
Sodium: 139 mmol/L (ref 135–146)
Total Bilirubin: 0.7 mg/dL (ref 0.2–1.2)
Total Protein: 6.8 g/dL (ref 6.1–8.1)
eGFR: 79 mL/min/{1.73_m2} (ref >=60)

## 2020-12-11 LAB — LIPID PANEL W/REFLEX DIRECT LDL
Cholesterol: 159 mg/dL (ref <200)
HDL: 35 mg/dL — ABNORMAL LOW (ref >=40)
LDL Cholesterol (Calc): 96 mg/dL (calc)
Non-HDL Cholesterol (Calc): 124 mg/dL (calc) (ref <130)
Total CHOL/HDL Ratio: 4.5 (calc) (ref <5.0)
Triglycerides: 190 mg/dL — ABNORMAL HIGH (ref <150)

## 2020-12-11 LAB — POCT GLYCOSYLATED HEMOGLOBIN (HGB A1C): Hemoglobin A1C: 6.9 % — AB (ref 4.0–5.6)

## 2020-12-11 MED ORDER — TRULICITY 3 MG/0.5ML ~~LOC~~ SOAJ: 3.0000 mg | SUBCUTANEOUS | 1 refills | Status: DC

## 2020-12-11 NOTE — Assessment & Plan Note (Signed)
Tolerating statin well without any side effects or problems.  Due to recheck lipids.

## 2020-12-11 NOTE — Progress Notes (Signed)
Established Patient Office Visit  Subjective:  Patient ID: Benjamin Wright, male    DOB: January 14, 1961  Age: 60 y.o. MRN: [id number]  CC:  Chief Complaint  Patient presents with   Diabetes    HPI Benjamin Wright presents for    Diabetes - no hypoglycemic events. No wounds or sores that are not healing well. No increased thirst or urination. [name]. 140-150.   + Exercise. Taking medications as prescribed without any side effects.   Past Medical History:  Diagnosis Date   Atrial fibrillation ([place])    Diabetes mellitus without complication ([place])    Hyperlipidemia    Hypertension     Past Surgical History:  Procedure Laterality Date   cyst removal from ankle     finger reattachment     VASECTOMY      Family History  Problem Relation Age of Onset   Diabetes Mother    Heart attack Father    Stomach cancer Maternal Grandfather    Colon cancer Neg Hx    Esophageal cancer Neg Hx    Rectal cancer Neg Hx     Social History   Socioeconomic History   Marital status: Married    Spouse name: [name]   Number of children: Not on file   Years of education: Not on file   Highest education level: Not on file  Occupational History   Occupation: Theme park manager  Tobacco Use   Smoking status: Some Days    Types: Pipe, Cigars   Smokeless tobacco: Never  Vaping Use   Vaping Use: Never used  Substance and Sexual Activity   Alcohol use: Yes    Comment: social   Drug use: Never   Sexual activity: Yes    Partners: Female  Other Topics Concern   Not on file  Social History Narrative   Not on file   Social Determinants of Health   Financial Resource Strain: Not on file  Food Insecurity: Not on file  Transportation Needs: Not on file  Physical Activity: Not on file  Stress: Not on file  Social Connections: Not on file  Intimate Partner Violence: Not on file    Outpatient Medications Prior to Visit  Medication Sig Dispense Refill   AMBULATORY NON FORMULARY  MEDICATION Medication Name: LIVONGO     aspirin EC 81 MG tablet Take 81 mg by mouth daily.     Dapagliflozin-metFORMIN HCl ER (XIGDUO XR) 02-999 MG TB24 Take 1 tablet by mouth daily. 90 tablet 1   lisinopril (ZESTRIL) 2.5 MG tablet Take 1 tablet (2.5 mg total) by mouth daily. 90 tablet 1   pravastatin (PRAVACHOL) 20 MG tablet Take 1 tablet (20 mg total) by mouth at bedtime. 90 tablet 1   Dulaglutide (TRULICITY) 1.5 TO/6.7TI SOPN Inject 1.5 mg into the skin once a week. 12 mL 1   glucose blood (ACCU-CHEK AVIVA PLUS) test strip Use to check blood sugars twice a week.     No facility-administered medications prior to visit.    No Known Allergies  ROS Review of Systems    Objective:    Physical Exam Constitutional:      Appearance: Normal appearance. He is well-developed.  HENT:     Head: Normocephalic and atraumatic.  Cardiovascular:     Rate and Rhythm: Normal rate and regular rhythm.     Heart sounds: Normal heart sounds.  Pulmonary:     Effort: Pulmonary effort is normal.     Breath sounds: Normal breath  sounds.  Skin:    General: Skin is warm and dry.  Neurological:     Mental Status: He is alert and oriented to person, place, and time. Mental status is at baseline.  Psychiatric:        Behavior: Behavior normal.    BP 131/87   Pulse 86   Ht 5\' 9"  (1.753 m)   Wt 211 lb (95.7 kg)   SpO2 98%   BMI 31.16 kg/m  Wt Readings from Last 3 Encounters:  12/11/20 211 lb (95.7 kg)  09/11/20 212 lb (96.2 kg)  06/13/20 215 lb (97.5 kg)     Health Maintenance Due  Topic Date Due   COVID-19 Vaccine (1) Never done   HIV Screening  Never done   OPHTHALMOLOGY EXAM  12/06/2020    There are no preventive care reminders to display for this patient.  No results found for: TSH Lab Results  Component Value Date   WBC 11.2 (H) 03/24/2011   HGB 16.8 03/24/2011   HCT 46.2 03/24/2011   MCV 87.0 03/24/2011   PLT 161 03/24/2011   Lab Results  Component Value Date   NA 140  06/13/2020   K 4.6 06/13/2020   CO2 26 06/13/2020   GLUCOSE 159 (H) 06/13/2020   BUN 21 06/13/2020   CREATININE 1.00 06/13/2020   BILITOT 0.6 11/02/2018   ALKPHOS 55 01/17/2009   AST 13 11/02/2018   ALT 18 11/02/2018   PROT 6.5 11/02/2018   ALBUMIN 4.3 01/17/2009   CALCIUM 9.7 06/13/2020   Lab Results  Component Value Date   CHOL 141 01/01/2020   Lab Results  Component Value Date   HDL 29 (L) 01/01/2020   Lab Results  Component Value Date   LDLCALC 82 01/01/2020   Lab Results  Component Value Date   TRIG 207 (H) 01/01/2020   Lab Results  Component Value Date   CHOLHDL 4.9 01/01/2020   Lab Results  Component Value Date   HGBA1C 6.9 (A) 12/11/2020      Assessment & Plan:   Problem List Items Addressed This Visit       Endocrine   Hyperlipidemia associated with type 2 diabetes mellitus ([place])    Tolerating statin well without any side effects or problems.  Due to recheck lipids.       Relevant Medications   Dulaglutide (TRULICITY) 3 XF/8.1WE SOPN   Other Relevant Orders   Lipid Panel w/reflex Direct LDL   COMPLETE METABOLIC PANEL WITH GFR   CBC   Controlled diabetes mellitus type 2 with complications ([facility]) - Primary    A1c up a little bit today at 6.9 from previous of 6.7.  He really has made some major changes to his diet which are really fantastic and he started exercising more recently.  Just encouraged him to continue to work on those changes.  I am can increase his Trulicity to 3 mg.  Plan to follow-up in 3 months.  We will get updated labs today continue ACE inhibitor and statin.       Relevant Medications   Dulaglutide (TRULICITY) 3 XH/3.7JI SOPN   Other Relevant Orders   POCT glycosylated hemoglobin (Hb A1C) (Completed)   Lipid Panel w/reflex Direct LDL   COMPLETE METABOLIC PANEL WITH GFR   CBC     Other   BMI 31.0-31.9,adult    Just continue to work on exercise and encouraged him to work on losing about 15 pounds it would make a big  difference with insulin  resistance.       Relevant Orders   Lipid Panel w/reflex Direct LDL   COMPLETE METABOLIC PANEL WITH GFR   CBC    Meds ordered this encounter  Medications   Dulaglutide (TRULICITY) 3 EW/2.5RK SOPN    Sig: Inject 3 mg as directed once a week.    Dispense:  6 mL    Refill:  1     Follow-up: Return in about 3 months (around 03/13/2021) for Diabetes follow-up.    Beatrice Lecher, MD

## 2020-12-11 NOTE — Assessment & Plan Note (Signed)
Just continue to work on exercise and encouraged him to work on losing about 15 pounds it would make a big difference with insulin resistance.

## 2020-12-11 NOTE — Assessment & Plan Note (Signed)
A1c up a little bit today at 6.9 from previous of 6.7.  He really has made some major changes to his diet which are really fantastic and he started exercising more recently.  Just encouraged him to continue to work on those changes.  I am can increase his Trulicity to 3 mg.  Plan to follow-up in 3 months.  We will get updated labs today continue ACE inhibitor and statin.

## 2021-01-20 ENCOUNTER — Encounter: Payer: Self-pay | Admitting: Family Medicine

## 2021-01-20 MED ORDER — AZITHROMYCIN 250 MG PO TABS: ORAL_TABLET | ORAL | 0 refills | Status: AC

## 2021-01-20 NOTE — Telephone Encounter (Signed)
Meds ordered this encounter  ?Medications  ? azithromycin (ZITHROMAX) 250 MG tablet  ?  Sig: 2 Ttabs PO on Day 1, then one a day x 4 days.  ?  Dispense:  6 tablet  ?  Refill:  0  ? ? ?

## 2021-03-13 ENCOUNTER — Encounter: Payer: Self-pay | Admitting: Family Medicine

## 2021-03-13 ENCOUNTER — Other Ambulatory Visit: Payer: Self-pay

## 2021-03-13 ENCOUNTER — Ambulatory Visit: Payer: Federal, State, Local not specified - PPO | Admitting: Family Medicine

## 2021-03-13 VITALS — BP 130/69 | HR 87 | Ht 69.0 in | Wt 210.0 lb

## 2021-03-13 DIAGNOSIS — M25512 Pain in left shoulder: Secondary | ICD-10-CM | POA: Diagnosis not present

## 2021-03-13 DIAGNOSIS — E118 Type 2 diabetes mellitus with unspecified complications: Secondary | ICD-10-CM | POA: Diagnosis not present

## 2021-03-13 DIAGNOSIS — K2100 Gastro-esophageal reflux disease with esophagitis, without bleeding: Secondary | ICD-10-CM | POA: Diagnosis not present

## 2021-03-13 DIAGNOSIS — Z23 Encounter for immunization: Secondary | ICD-10-CM | POA: Diagnosis not present

## 2021-03-13 DIAGNOSIS — K219 Gastro-esophageal reflux disease without esophagitis: Secondary | ICD-10-CM | POA: Insufficient documentation

## 2021-03-13 LAB — POCT GLYCOSYLATED HEMOGLOBIN (HGB A1C): Hemoglobin A1C: 7.1 % — AB (ref 4.0–5.6)

## 2021-03-13 LAB — HM DIABETES EYE EXAM

## 2021-03-13 MED ORDER — PANTOPRAZOLE SODIUM 40 MG PO TBEC: 40.0000 mg | DELAYED_RELEASE_TABLET | Freq: Every day | ORAL | 1 refills | Status: DC

## 2021-03-13 NOTE — Assessment & Plan Note (Signed)
Gust recommendation for PPI for at least 2 weeks to see if that improves his symptoms.  If it does then recommend full treatment for 6 weeks and then taper off.  Okay to still use Tums as needed.  If not improving then please let me know and we will consider testing for H. pylori.

## 2021-03-13 NOTE — Assessment & Plan Note (Addendum)
Not well controlled. A1C up to 7.1. he went on vacation. Discussed option of going uip on the trulicity but he is still having some intermittant nausea on the 3mg  dose.  . Continue current regimen. Follow up in  3-4 mo

## 2021-03-13 NOTE — Progress Notes (Signed)
Pt reports that his BS this morning was 142

## 2021-03-13 NOTE — Progress Notes (Signed)
Established Patient Office Visit  Subjective:  Patient ID: Benjamin Wright, male    DOB: 08/10/1960  Age: 60 y.o. MRN: [id number]  CC:  Chief Complaint  Patient presents with   Diabetes    HPI Benjamin Wright presents for   Diabetes - no hypoglycemic events. No wounds or sores that are not healing well. No increased thirst or urination. [name]. Taking medications as prescribed without any side effects. Went on vacation about a month ago and so is expecting his blood sugars to be a little elevated.  Left shoulder pain x 6 wks., no trauma or injury.  He just notices it is painful to reach back and to reach out but he can do.  He says it was feels like something is pinching its in the general area of the outer upper arm.  Sometimes a little bit more anterior.  He said there is not one pinpoint place where it is painful.  It is hard to sleep on that shoulder.    Also reports that has been having more reflux than usual.  He says he is waking up a few nights a week in the middle the night with heartburn and has to take a Tums he says this started probably also about 6 weeks ago before he went on vacation he denies any changes in caffeine intake, greasy spicy foods which he tends to avoid anyway.  He has not really been eating a lot of acidic foods.  Past Medical History:  Diagnosis Date   Atrial fibrillation ([place])    Diabetes mellitus without complication ([place])    Hyperlipidemia    Hypertension     Past Surgical History:  Procedure Laterality Date   cyst removal from ankle     finger reattachment     VASECTOMY      Family History  Problem Relation Age of Onset   Diabetes Mother    Heart attack Father    Stomach cancer Maternal Grandfather    Colon cancer Neg Hx    Esophageal cancer Neg Hx    Rectal cancer Neg Hx     Social History   Socioeconomic History   Marital status: Married    Spouse name: [name]   Number of children: Not on file   Years of  education: Not on file   Highest education level: Not on file  Occupational History   Occupation: Theme park manager  Tobacco Use   Smoking status: Some Days    Types: Pipe, Cigars   Smokeless tobacco: Never  Vaping Use   Vaping Use: Never used  Substance and Sexual Activity   Alcohol use: Yes    Comment: social   Drug use: Never   Sexual activity: Yes    Partners: Female  Other Topics Concern   Not on file  Social History Narrative   Not on file   Social Determinants of Health   Financial Resource Strain: Not on file  Food Insecurity: Not on file  Transportation Needs: Not on file  Physical Activity: Not on file  Stress: Not on file  Social Connections: Not on file  Intimate Partner Violence: Not on file    Outpatient Medications Prior to Visit  Medication Sig Dispense Refill   AMBULATORY NON FORMULARY MEDICATION Medication Name: LIVONGO     aspirin EC 81 MG tablet Take 81 mg by mouth daily.     Dapagliflozin-metFORMIN HCl ER (XIGDUO XR) 02-999 MG TB24 Take 1 tablet by mouth daily. [place]  tablet 1   Dulaglutide (TRULICITY) 3 NO/0.3BC SOPN Inject 3 mg as directed once a week. 6 mL 1   lisinopril (ZESTRIL) 2.5 MG tablet Take 1 tablet (2.5 mg total) by mouth daily. 90 tablet 1   pravastatin (PRAVACHOL) 20 MG tablet Take 1 tablet (20 mg total) by mouth at bedtime. 90 tablet 1   No facility-administered medications prior to visit.    No Known Allergies  ROS Review of Systems    Objective:    Physical Exam Constitutional:      Appearance: Normal appearance. He is well-developed.  HENT:     Head: Normocephalic and atraumatic.  Cardiovascular:     Rate and Rhythm: Normal rate and regular rhythm.     Heart sounds: Normal heart sounds.  Pulmonary:     Effort: Pulmonary effort is normal.     Breath sounds: Normal breath sounds.  Musculoskeletal:     Comments: Left shoulder with normal range of motion though pain with full extension and reaching behind his back.  Negative empty  can test though he did have pain.  Skin:    General: Skin is warm and dry.  Neurological:     Mental Status: He is alert and oriented to person, place, and time. Mental status is at baseline.  Psychiatric:        Behavior: Behavior normal.    BP 130/69   Pulse 87   Ht $R'5\' 9"'hC$  (1.753 m)   Wt 210 lb (95.3 kg)   SpO2 98%   BMI 31.01 kg/m  Wt Readings from Last 3 Encounters:  03/13/21 210 lb (95.3 kg)  12/11/20 211 lb (95.7 kg)  09/11/20 212 lb (96.2 kg)     Health Maintenance Due  Topic Date Due   COVID-19 Vaccine (1) Never done   HIV Screening  Never done   OPHTHALMOLOGY EXAM  12/06/2020    There are no preventive care reminders to display for this patient.  No results found for: TSH Lab Results  Component Value Date   WBC 5.5 12/11/2020   HGB 17.9 (H) 12/11/2020   HCT 53.5 (H) 12/11/2020   MCV 90.8 12/11/2020   PLT 169 12/11/2020   Lab Results  Component Value Date   NA 139 12/11/2020   K 4.6 12/11/2020   CO2 28 12/11/2020   GLUCOSE 116 (H) 12/11/2020   BUN 17 12/11/2020   CREATININE 1.06 12/11/2020   BILITOT 0.7 12/11/2020   ALKPHOS 55 01/17/2009   AST 18 12/11/2020   ALT 23 12/11/2020   PROT 6.8 12/11/2020   ALBUMIN 4.3 01/17/2009   CALCIUM 9.5 12/11/2020   EGFR 79 12/11/2020   Lab Results  Component Value Date   CHOL 159 12/11/2020   Lab Results  Component Value Date   HDL 35 (L) 12/11/2020   Lab Results  Component Value Date   LDLCALC 96 12/11/2020   Lab Results  Component Value Date   TRIG 190 (H) 12/11/2020   Lab Results  Component Value Date   CHOLHDL 4.5 12/11/2020   Lab Results  Component Value Date   HGBA1C 7.1 (A) 03/13/2021      Assessment & Plan:   Problem List Items Addressed This Visit       Digestive   GERD (gastroesophageal reflux disease)    Gust recommendation for PPI for at least 2 weeks to see if that improves his symptoms.  If it does then recommend full treatment for 6 weeks and then taper off.  Okay to  still use Tums as needed.  If not improving then please let me know and we will consider testing for H. pylori.      Relevant Medications   pantoprazole (PROTONIX) 40 MG tablet     Endocrine   Controlled diabetes mellitus type 2 with complications ([place]) - Primary    Not well controlled. A1C up to 7.1. he went on vacation. Discussed option of going uip on the trulicity but he is still having some intermittant nausea on the $Remo'3mg'hbHiv$  dose.  . Continue current regimen. Follow up in  3-4 mo      Relevant Orders   POCT glycosylated hemoglobin (Hb A1C) (Completed)   Other Visit Diagnoses     Need for immunization against influenza       Relevant Orders   Flu Vaccine QUAD 40mo+IM (Fluarix, Fluzone & Alfiuria Quad PF) (Completed)   Acute pain of left shoulder          Left shoulder pain-consider bursitis versus impingement.  He has good range of motion today but he does have pain at full extension and internal rotation.  Negative empty can test though it was painful.  Normal internal and external rotation of the shoulder.  And handout on stretches and exercises to do for the next 2 weeks.  If not improving please follow-up with our sports medicine doctor.  Meds ordered this encounter  Medications   pantoprazole (PROTONIX) 40 MG tablet    Sig: Take 1 tablet (40 mg total) by mouth at bedtime.    Dispense:  30 tablet    Refill:  1     Follow-up: Return in about 3 months (around 06/16/2021) for Diabetes follow-up.    Beatrice Lecher, MD

## 2021-03-19 ENCOUNTER — Other Ambulatory Visit: Payer: Self-pay | Admitting: Family Medicine

## 2021-03-19 DIAGNOSIS — E118 Type 2 diabetes mellitus with unspecified complications: Secondary | ICD-10-CM

## 2021-03-24 ENCOUNTER — Other Ambulatory Visit: Payer: Self-pay | Admitting: Family Medicine

## 2021-03-24 DIAGNOSIS — E118 Type 2 diabetes mellitus with unspecified complications: Secondary | ICD-10-CM

## 2021-03-24 DIAGNOSIS — E1129 Type 2 diabetes mellitus with other diabetic kidney complication: Secondary | ICD-10-CM

## 2021-05-21 ENCOUNTER — Other Ambulatory Visit: Payer: Self-pay | Admitting: Family Medicine

## 2021-06-04 ENCOUNTER — Telehealth: Payer: Self-pay

## 2021-06-04 NOTE — Telephone Encounter (Signed)
Medication: XIGDUO XR 02-999 MG TB24 Prior authorization determination received Medication has been approved Approval dates: 05/05/2021-06/04/2022  Patient aware via: [place] aware: Yes Provider aware via this encounter

## 2021-06-04 NOTE — Telephone Encounter (Signed)
Medication: XIGDUO XR 02-999 MG TB24 Prior authorization submitted via CoverMyMeds on 06/04/2021 [place] submission pending

## 2021-06-05 ENCOUNTER — Other Ambulatory Visit: Payer: Self-pay | Admitting: Family Medicine

## 2021-06-09 DIAGNOSIS — B078 Other viral warts: Secondary | ICD-10-CM | POA: Diagnosis not present

## 2021-06-16 ENCOUNTER — Encounter: Payer: Self-pay | Admitting: Family Medicine

## 2021-06-16 ENCOUNTER — Ambulatory Visit: Payer: Federal, State, Local not specified - PPO | Admitting: Family Medicine

## 2021-06-16 ENCOUNTER — Other Ambulatory Visit: Payer: Self-pay

## 2021-06-16 VITALS — BP 129/81 | HR 83 | Resp 16 | Ht 69.0 in | Wt 208.0 lb

## 2021-06-16 DIAGNOSIS — Z23 Encounter for immunization: Secondary | ICD-10-CM | POA: Diagnosis not present

## 2021-06-16 DIAGNOSIS — E118 Type 2 diabetes mellitus with unspecified complications: Secondary | ICD-10-CM | POA: Diagnosis not present

## 2021-06-16 DIAGNOSIS — M25512 Pain in left shoulder: Secondary | ICD-10-CM

## 2021-06-16 LAB — BASIC METABOLIC PANEL WITH GFR
BUN: 21 mg/dL (ref 7–25)
CO2: 32 mmol/L (ref 20–32)
Calcium: 10.1 mg/dL (ref 8.6–10.3)
Chloride: 103 mmol/L (ref 98–110)
Creat: 1.18 mg/dL (ref 0.70–1.35)
Glucose, Bld: 168 mg/dL — ABNORMAL HIGH (ref 65–99)
Potassium: 4.7 mmol/L (ref 3.5–5.3)
Sodium: 140 mmol/L (ref 135–146)
eGFR: 70 mL/min/{1.73_m2} (ref >=60)

## 2021-06-16 LAB — POCT GLYCOSYLATED HEMOGLOBIN (HGB A1C): Hemoglobin A1C: 7 % — AB (ref 4.0–5.6)

## 2021-06-16 NOTE — Progress Notes (Signed)
Established Patient Office Visit  Subjective:  Patient ID: Benjamin Wright, male    DOB: 09-10-1960  Age: 61 y.o. MRN: [id number]  CC:  Chief Complaint  Patient presents with   Diabetes    Follow up    Shoulder Pain    Left shoulder pain for 4 months, No known injury. Patient would like to discuss exercises to help.     HPI Benjamin Wright presents for   Diabetes - no hypoglycemic events. No wounds or sores that are not healing well. No increased thirst or urination. [name]. Taking medications as prescribed without any side effects.  He has had some difficulty getting his Trulicity when he goes to pick it up its often times on backorder.  They are supposed to get it in on Wednesday and his next dose is due on Friday he did have to skip 1 week completely over the last month.  He is doing well overall he has started exercising again in the last 2 weeks and is already lost a couple of pounds.  He is hoping to really help get his A1c down he feels like he is doing pretty good job with portion controlling and just more so what he is eating but he says he still working on it.  He is also still having some problems with his left shoulder we take a look at it when I saw him back in October.  He would like a handout with some exercises to do.  Past Medical History:  Diagnosis Date   Atrial fibrillation ([place])    Diabetes mellitus without complication ([place])    Hyperlipidemia    Hypertension     Past Surgical History:  Procedure Laterality Date   cyst removal from ankle     finger reattachment     VASECTOMY      Family History  Problem Relation Age of Onset   Diabetes Mother    Heart attack Father    Stomach cancer Maternal Grandfather    Colon cancer Neg Hx    Esophageal cancer Neg Hx    Rectal cancer Neg Hx     Social History   Socioeconomic History   Marital status: Married    Spouse name: [name]   Number of children: Not on file   Years of  education: Not on file   Highest education level: Not on file  Occupational History   Occupation: Theme park manager  Tobacco Use   Smoking status: Some Days    Types: Pipe, Cigars   Smokeless tobacco: Never   Tobacco comments:    Once a month pipe, cigars  Vaping Use   Vaping Use: Never used  Substance and Sexual Activity   Alcohol use: Yes    Comment: social   Drug use: Never   Sexual activity: Yes    Partners: Female  Other Topics Concern   Not on file  Social History Narrative   Not on file   Social Determinants of Health   Financial Resource Strain: Not on file  Food Insecurity: Not on file  Transportation Needs: Not on file  Physical Activity: Not on file  Stress: Not on file  Social Connections: Not on file  Intimate Partner Violence: Not on file    Outpatient Medications Prior to Visit  Medication Sig Dispense Refill   AMBULATORY NON FORMULARY MEDICATION Medication Name: LIVONGO     aspirin EC 81 MG tablet Take 81 mg by mouth daily.  Dulaglutide (TRULICITY) 3 QB/3.4LP SOPN INJECT 3 MG AS DIRECTED ONCE A WEEK. 6 mL 1   lisinopril (ZESTRIL) 2.5 MG tablet TAKE 1 TABLET BY MOUTH EVERY DAY 90 tablet 1   pantoprazole (PROTONIX) 40 MG tablet TAKE 1 TABLET BY MOUTH EVERYDAY AT BEDTIME 30 tablet 1   pravastatin (PRAVACHOL) 20 MG tablet TAKE 1 TABLET BY MOUTH EVERYDAY AT BEDTIME 90 tablet 1   XIGDUO XR 02-999 MG TB24 TAKE 1 TABLET BY MOUTH ONCE DAILY 90 tablet 1   No facility-administered medications prior to visit.    No Known Allergies  ROS Review of Systems    Objective:    Physical Exam Constitutional:      Appearance: Normal appearance. He is well-developed.  HENT:     Head: Normocephalic and atraumatic.  Cardiovascular:     Rate and Rhythm: Normal rate and regular rhythm.     Heart sounds: Normal heart sounds.  Pulmonary:     Effort: Pulmonary effort is normal.     Breath sounds: Normal breath sounds.  Skin:    General: Skin is warm and dry.   Neurological:     Mental Status: He is alert and oriented to person, place, and time. Mental status is at baseline.  Psychiatric:        Behavior: Behavior normal.    BP 129/81    Pulse 83    Resp 16    Ht _0  (1.753 m)    Wt 208 lb (94.3 kg)    SpO2 96%    BMI 30.72 kg/m  Wt Readings from Last 3 Encounters:  06/16/21 208 lb (94.3 kg)  03/13/21 210 lb (95.3 kg)  12/11/20 211 lb (95.7 kg)     Health Maintenance Due  Topic Date Due   HIV Screening  Never done   OPHTHALMOLOGY EXAM  12/06/2020    There are no preventive care reminders to display for this patient.  No results found for: TSH Lab Results  Component Value Date   WBC 5.5 12/11/2020   HGB 17.9 (H) 12/11/2020   HCT 53.5 (H) 12/11/2020   MCV 90.8 12/11/2020   PLT 169 12/11/2020   Lab Results  Component Value Date   NA 139 12/11/2020   K 4.6 12/11/2020   CO2 28 12/11/2020   GLUCOSE 116 (H) 12/11/2020   BUN 17 12/11/2020   CREATININE 1.06 12/11/2020   BILITOT 0.7 12/11/2020   ALKPHOS 55 01/17/2009   AST 18 12/11/2020   ALT 23 12/11/2020   PROT 6.8 12/11/2020   ALBUMIN 4.3 01/17/2009   CALCIUM 9.5 12/11/2020   EGFR 79 12/11/2020   Lab Results  Component Value Date   CHOL 159 12/11/2020   Lab Results  Component Value Date   HDL 35 (L) 12/11/2020   Lab Results  Component Value Date   LDLCALC 96 12/11/2020   Lab Results  Component Value Date   TRIG 190 (H) 12/11/2020   Lab Results  Component Value Date   CHOLHDL 4.5 12/11/2020   Lab Results  Component Value Date   HGBA1C 7.0 (A) 06/16/2021      Assessment & Plan:   Problem List Items Addressed This Visit       Endocrine   Controlled diabetes mellitus type 2 with complications ([place]) - Primary    A1c looks significantly better today at 7.0 down from 7.1.  Discussed goal of getting his A1c under 7.  Discussed increasing Trulicity to 4.5 mg to see if this is helpful  and continue to work on healthy diet and regular exercise.  He wants to  continue to work on lifestyle changes first before going up on the medication.  If he continues to have problems getting 3 mg dose we may be able to better get the 4.5 mg dose.  So he can just let me know.  Continue statin and ACE inhibitor.  Also on Xigduo.  Follow-up in 3 to 4 months.      Relevant Orders   POCT HgB A1C (Completed)   BASIC METABOLIC PANEL WITH GFR   Other Visit Diagnoses     Acute pain of left shoulder           Left shoulder-encouraged him to get in with our sports med doc since he has been doing the exercises for the last couple of months and it does not seem to be resolving.  No orders of the defined types were placed in this encounter.   Follow-up: Return in about 14 weeks (around 09/22/2021) for Diabetes follow-up.    Beatrice Lecher, MD

## 2021-06-16 NOTE — Progress Notes (Signed)
Your lab work is within acceptable range and there are no concerning findings.   ?

## 2021-06-16 NOTE — Assessment & Plan Note (Addendum)
A1c looks significantly better today at 7.0 down from 7.1.  Discussed goal of getting his A1c under 7.  Discussed increasing Trulicity to 4.5 mg to see if this is helpful and continue to work on healthy diet and regular exercise.  He wants to continue to work on lifestyle changes first before going up on the medication.  If he continues to have problems getting 3 mg dose we may be able to better get the 4.5 mg dose.  So he can just let me know.  Continue statin and ACE inhibitor.  Also on Xigduo.  Follow-up in 3 to 4 months.

## 2021-06-16 NOTE — Patient Instructions (Addendum)
Your shoulder looks good you with our sports med doc and see what he thinks might be going on and see if we need to be a little bit more aggressive in getting you better.

## 2021-06-16 NOTE — Progress Notes (Signed)
Patient states eye exam was done 03/2021 at [facility]. Release form signed

## 2021-06-16 NOTE — Addendum Note (Signed)
Addended by: Izora Gala on: 06/16/2021 10:46 AM   Modules accepted: Orders

## 2021-06-23 ENCOUNTER — Ambulatory Visit: Payer: Federal, State, Local not specified - PPO | Admitting: Sports Medicine

## 2021-06-23 ENCOUNTER — Other Ambulatory Visit: Payer: Self-pay

## 2021-06-23 ENCOUNTER — Ambulatory Visit (INDEPENDENT_AMBULATORY_CARE_PROVIDER_SITE_OTHER): Payer: Federal, State, Local not specified - PPO

## 2021-06-23 ENCOUNTER — Other Ambulatory Visit: Payer: Self-pay | Admitting: Family Medicine

## 2021-06-23 DIAGNOSIS — M19012 Primary osteoarthritis, left shoulder: Secondary | ICD-10-CM | POA: Diagnosis not present

## 2021-06-23 DIAGNOSIS — M25561 Pain in right knee: Secondary | ICD-10-CM

## 2021-06-23 DIAGNOSIS — M170 Bilateral primary osteoarthritis of knee: Secondary | ICD-10-CM | POA: Diagnosis not present

## 2021-06-23 DIAGNOSIS — M25562 Pain in left knee: Secondary | ICD-10-CM | POA: Diagnosis not present

## 2021-06-23 DIAGNOSIS — G8929 Other chronic pain: Secondary | ICD-10-CM

## 2021-06-23 DIAGNOSIS — M25512 Pain in left shoulder: Secondary | ICD-10-CM

## 2021-06-23 DIAGNOSIS — I700 Atherosclerosis of aorta: Secondary | ICD-10-CM | POA: Diagnosis not present

## 2021-06-23 DIAGNOSIS — M25712 Osteophyte, left shoulder: Secondary | ICD-10-CM | POA: Diagnosis not present

## 2021-06-23 MED ORDER — MELOXICAM 15 MG PO TABS: ORAL_TABLET | ORAL | 3 refills | Status: DC

## 2021-06-23 NOTE — Assessment & Plan Note (Addendum)
Benjamin Wright has also had bilateral knee pain, right worse than left, localized of the kneecap worse going up and down stairs, minimal patellar crepitus. No effusion, we will get some x-rays, I like him to do some patellofemoral conditioning, meloxicam as above will help. Return to see me in a month for this, injection if no better.

## 2021-06-23 NOTE — Progress Notes (Signed)
° ° °  Procedures performed today:    None.  Independent interpretation of notes and tests performed by another provider:   None.  Brief History, Exam, Impression, and Recommendations:    Chronic left shoulder pain Benjamin Wright is a very pleasant 61 year old male, he has had months of months of pain in his left shoulder localized over the deltoid, worse with internal rotation. He did see his PCP, he was given some rotator cuff conditioning exercises and has done this for at least 6 weeks now, he is also done naproxen. Unfortunately has not noticed improvement. On exam he has a positive liftoff sign, positive Neer's and Hawkins signs, positive crank test. Minimally positive speeds test. My suspicion is rotator cuff dysfunction/shoulder bursitis, he likely has some glenohumeral osteoarthritis and bicipital tendinitis as well. Switching from over-the-counter naproxen to meloxicam, adding x-rays, MRI considering failure of conservative treatment, I do suspect we will be doing a subacromial injection. Return for MRI results.  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 No effusion, we will get some x-rays, I like him to do some patellofemoral conditioning, meloxicam as above will help. Return to see me in a month for this, injection if no better.    ___________________________________________ Gwen Her. Dianah Field, M.D., ABFM., CAQSM. Primary Care and [place] Instructor of [facility]

## 2021-06-23 NOTE — Assessment & Plan Note (Signed)
Benjamin Wright is a very pleasant 61 year old male, he has had months of months of pain in his left shoulder localized over the deltoid, worse with internal rotation. He did see his PCP, he was given some rotator cuff conditioning exercises and has done this for at least 6 weeks now, he is also done naproxen. Unfortunately has not noticed improvement. On exam he has a positive liftoff sign, positive Neer's and Hawkins signs, positive crank test. Minimally positive speeds test. My suspicion is rotator cuff dysfunction/shoulder bursitis, he likely has some glenohumeral osteoarthritis and bicipital tendinitis as well. Switching from over-the-counter naproxen to meloxicam, adding x-rays, MRI considering failure of conservative treatment, I do suspect we will be doing a subacromial injection. Return for MRI results.

## 2021-07-01 ENCOUNTER — Ambulatory Visit (INDEPENDENT_AMBULATORY_CARE_PROVIDER_SITE_OTHER): Payer: Federal, State, Local not specified - PPO

## 2021-07-01 ENCOUNTER — Other Ambulatory Visit: Payer: Self-pay

## 2021-07-01 DIAGNOSIS — M25512 Pain in left shoulder: Secondary | ICD-10-CM | POA: Diagnosis not present

## 2021-07-01 DIAGNOSIS — M75112 Incomplete rotator cuff tear or rupture of left shoulder, not specified as traumatic: Secondary | ICD-10-CM | POA: Diagnosis not present

## 2021-07-01 DIAGNOSIS — G8929 Other chronic pain: Secondary | ICD-10-CM

## 2021-07-08 ENCOUNTER — Ambulatory Visit (INDEPENDENT_AMBULATORY_CARE_PROVIDER_SITE_OTHER): Payer: Federal, State, Local not specified - PPO

## 2021-07-08 ENCOUNTER — Ambulatory Visit: Payer: Federal, State, Local not specified - PPO | Admitting: Sports Medicine

## 2021-07-08 ENCOUNTER — Other Ambulatory Visit: Payer: Self-pay

## 2021-07-08 DIAGNOSIS — M25512 Pain in left shoulder: Secondary | ICD-10-CM

## 2021-07-08 DIAGNOSIS — G8929 Other chronic pain: Secondary | ICD-10-CM | POA: Diagnosis not present

## 2021-07-08 NOTE — Assessment & Plan Note (Signed)
Benjamin Wright returns, he is a very pleasant 61 year old male, months of shoulder pain, left-sided localized over the deltoid, worse with reaching, internal rotation. He has impingement signs, he also has glenohumeral signs on exam today. X-rays did show some glenohumeral osteoarthritis, naproxen ineffective, meloxicam ineffective. Ultimately MRI showed rotator cuff tendinosis and some minor bursal sided supraspinatus tearing. As he has both impingement and glenohumeral signs we injected his subacromial bursa and glenohumeral joint today. Return to see me in a month. If insufficient improvement we will consider referral to Dr. Griffin Basil for surgical intervention.

## 2021-07-08 NOTE — Progress Notes (Signed)
° ° °  Procedures performed today:    Procedure: Real-time Ultrasound Guided injection of the left subacromial bursa Device: Samsung HS60  Verbal informed consent obtained.  Time-out conducted.  Noted no overlying erythema, induration, or other signs of local infection.  Skin prepped in a sterile fashion.  Local anesthesia: Topical Ethyl chloride.  With sterile technique and under real time ultrasound guidance: Intact rotator cuff noted, 1 cc lidocaine, 1 cc bupivacaine, 1 cc kenalog 40 injected easily. Completed without difficulty  Advised to call if fevers/chills, erythema, induration, drainage, or persistent bleeding.  Images permanently stored and available for review in PACS.  Impression: Technically successful ultrasound guided injection.  Procedure: Real-time Ultrasound Guided injection of the left glenohumeral joint Device: Samsung HS60  Verbal informed consent obtained.  Time-out conducted.  Noted no overlying erythema, induration, or other signs of local infection.  Skin prepped in a sterile fashion.  Local anesthesia: Topical Ethyl chloride.  With sterile technique and under real time ultrasound guidance: Noted arthritic joint, 1 cc Kenalog 40, 2 cc lidocaine, 2 cc bupivacaine injected easily Completed without difficulty  Advised to call if fevers/chills, erythema, induration, drainage, or persistent bleeding.  Images permanently stored and available for review in PACS.  Impression: Technically successful ultrasound guided injection.  Independent interpretation of notes and tests performed by another provider:   None.  Brief History, Exam, Impression, and Recommendations:    Chronic left shoulder pain Benjamin Wright returns, he is a very pleasant 62 year old male, months of shoulder pain, left-sided localized over the deltoid, worse with reaching, internal rotation. He has impingement signs, he also has glenohumeral signs on exam today. X-rays did show some glenohumeral  osteoarthritis, naproxen ineffective, meloxicam ineffective. Ultimately MRI showed rotator cuff tendinosis and some minor bursal sided supraspinatus tearing. As he has both impingement and glenohumeral signs we injected his subacromial bursa and glenohumeral joint today. Return to see me in a month. If insufficient improvement we will consider referral to Dr. Griffin Basil for surgical intervention.    ___________________________________________ Gwen Her. Dianah Field, M.D., ABFM., CAQSM. Primary Care and [place] Instructor of [place] [facility]

## 2021-07-11 ENCOUNTER — Other Ambulatory Visit: Payer: Self-pay | Admitting: Neurology

## 2021-07-11 MED ORDER — TRULICITY 3 MG/0.5ML ~~LOC~~ SOAJ: 3.0000 mg | SUBCUTANEOUS | 0 refills | Status: DC

## 2021-08-05 ENCOUNTER — Other Ambulatory Visit: Payer: Self-pay

## 2021-08-05 ENCOUNTER — Ambulatory Visit: Payer: Federal, State, Local not specified - PPO | Admitting: Sports Medicine

## 2021-08-05 DIAGNOSIS — M25512 Pain in left shoulder: Secondary | ICD-10-CM

## 2021-08-05 DIAGNOSIS — G8929 Other chronic pain: Secondary | ICD-10-CM

## 2021-08-05 NOTE — Assessment & Plan Note (Signed)
Gershon Mussel returns, he is a very pleasant 61 year old male, he had months of shoulder pain, left-sided, it was localized over the deltoid, he also had pain with internal rotation, he also had some glenohumeral signs on exam, at the last visit I injected his subacromial bursa and glenohumeral joint, he returns today for the most part pain-free, good motion, good strength, he can return as needed. ?MRI is in the chart. ?

## 2021-08-05 NOTE — Progress Notes (Signed)
? ? ?  Procedures performed today:   ? ?None. ? ?Independent interpretation of notes and tests performed by another provider:  ? ?None. ? ?Brief History, Exam, Impression, and Recommendations:   ? ?Chronic left shoulder pain ?Benjamin Wright returns, he is a very pleasant 61 year old male, he had months of shoulder pain, left-sided, it was localized over the deltoid, he also had pain with internal rotation, he also had some glenohumeral signs on exam, at the last visit I injected his subacromial bursa and glenohumeral joint, he returns today for the most part pain-free, good motion, good strength, he can return as needed. ?MRI is in the chart. ? ? ? ?___________________________________________ ?Gwen Her. Dianah Field, M.D., ABFM., CAQSM. ?Primary Care and Sports Medicine ?[place] ? ?Adjunct Instructor of Family Medicine  ?[employer] ?

## 2021-09-04 ENCOUNTER — Encounter: Payer: Self-pay | Admitting: Family Medicine

## 2021-09-05 MED ORDER — AZITHROMYCIN 250 MG PO TABS: ORAL_TABLET | ORAL | 0 refills | Status: AC

## 2021-09-08 ENCOUNTER — Other Ambulatory Visit: Payer: Self-pay | Admitting: Family Medicine

## 2021-09-08 DIAGNOSIS — E118 Type 2 diabetes mellitus with unspecified complications: Secondary | ICD-10-CM

## 2021-09-11 ENCOUNTER — Other Ambulatory Visit: Payer: Self-pay | Admitting: Family Medicine

## 2021-09-17 ENCOUNTER — Other Ambulatory Visit: Payer: Self-pay | Admitting: Family Medicine

## 2021-09-17 DIAGNOSIS — E1129 Type 2 diabetes mellitus with other diabetic kidney complication: Secondary | ICD-10-CM

## 2021-09-17 DIAGNOSIS — E118 Type 2 diabetes mellitus with unspecified complications: Secondary | ICD-10-CM

## 2021-09-22 ENCOUNTER — Encounter: Payer: Self-pay | Admitting: Family Medicine

## 2021-09-22 ENCOUNTER — Ambulatory Visit: Payer: Federal, State, Local not specified - PPO | Admitting: Family Medicine

## 2021-09-22 VITALS — BP 126/78 | HR 81 | Ht 69.0 in | Wt 203.0 lb

## 2021-09-22 DIAGNOSIS — R21 Rash and other nonspecific skin eruption: Secondary | ICD-10-CM | POA: Diagnosis not present

## 2021-09-22 DIAGNOSIS — R809 Proteinuria, unspecified: Secondary | ICD-10-CM | POA: Diagnosis not present

## 2021-09-22 DIAGNOSIS — E118 Type 2 diabetes mellitus with unspecified complications: Secondary | ICD-10-CM

## 2021-09-22 DIAGNOSIS — E1129 Type 2 diabetes mellitus with other diabetic kidney complication: Secondary | ICD-10-CM

## 2021-09-22 LAB — POCT UA - MICROALBUMIN
Creatinine, POC: 100 mg/dL
Microalbumin Ur, POC: 80 mg/L

## 2021-09-22 LAB — POCT GLYCOSYLATED HEMOGLOBIN (HGB A1C): Hemoglobin A1C: 6.6 % — AB (ref 4.0–5.6)

## 2021-09-22 MED ORDER — LISINOPRIL 2.5 MG PO TABS: 2.5000 mg | ORAL_TABLET | Freq: Every day | ORAL | 3 refills | Status: DC

## 2021-09-22 MED ORDER — PRAVASTATIN SODIUM 20 MG PO TABS: ORAL_TABLET | ORAL | 3 refills | Status: DC

## 2021-09-22 NOTE — Assessment & Plan Note (Signed)
Doing really well with current regimen.  A1c down to 6.6.  We discussed continuing to work on food choices and regular exercise.  My goal would be to consider working up on the Trulicity to maximum dose of 4.5 so that we can hopefully switch the Xigduo to just plain metformin and get him off of to branded medications.  Recently just filled a 50-monthsupply of the Trulicity.  Okay to just give me a call and we can always bump up the dose before the next refill.  Plan to follow-up in 3 to 4 months ? ?Lab Results  ?Component Value Date  ? HGBA1C 6.6 (A) 09/22/2021  ? ? ?

## 2021-09-22 NOTE — Progress Notes (Signed)
? ?Established Patient Office Visit ? ?Subjective   ?Patient ID: Benjamin Wright, male    DOB: Feb 20, 1961  Age: 61 y.o. MRN: [id number] ? ?Chief Complaint  ?Patient presents with  ? Diabetes  ? Hypertension  ? ? ?HPI ? ?Diabetes - no hypoglycemic events. No wounds or sores that are not healing well. No increased thirst or urination. [name]. Taking medications as prescribed without any side effects.  He has started walking recently for exercise.  Trying to eat little bit better as well.  He is getting ready to go on vacation. ? ?He also has a patch of skin on the arch of both in her feet though worse on the right.  He says it is itchy and the skin peels it comes and goes but it is flaring right now.  He also notices occasionally on the palmar side of his thumbs he will sometimes get some peeling skin especially around this time a year. ? ? ? ? ? ?ROS ? ?  ?Objective:  ?  ? ?BP 126/78   Pulse 81   Ht '5\' 9"'$  (1.753 m)   Wt 203 lb (92.1 kg)   SpO2 97%   BMI 29.98 kg/m?  ? ? ?Physical Exam ?Constitutional:   ?   Appearance: He is well-developed.  ?HENT:  ?   Head: Normocephalic and atraumatic.  ?Cardiovascular:  ?   Rate and Rhythm: Normal rate and regular rhythm.  ?   Heart sounds: Normal heart sounds.  ?Pulmonary:  ?   Effort: Pulmonary effort is normal.  ?   Breath sounds: Normal breath sounds.  ?Skin: ?   General: Skin is warm and dry.  ?   Comments: Peeling excoriated skin in a patch in the middle of his arch, bilat. Worse on the right.   ?Neurological:  ?   Mental Status: He is alert and oriented to person, place, and time.  ?Psychiatric:     ?   Behavior: Behavior normal.  ? ? ? ?Results for orders placed or performed in visit on 09/22/21  ?POCT glycosylated hemoglobin (Hb A1C)  ?Result Value Ref Range  ? Hemoglobin A1C 6.6 (A) 4.0 - 5.6 %  ? HbA1c POC (<> result, manual entry)    ? HbA1c, POC (prediabetic range)    ? HbA1c, POC (controlled diabetic range)    ?POCT UA - Microalbumin  ?Result  Value Ref Range  ? Microalbumin Ur, POC 80 mg/L  ? Creatinine, POC 100 mg/dL  ? Albumin/Creatinine Ratio, Urine, POC 30-300   ? ? ? ? ?The 10-year ASCVD risk score (Arnett DK, et al., 2019) is: 33% ? ?  ?Assessment & Plan:  ? ?Problem List Items Addressed This Visit   ? ?  ? Endocrine  ? Microalbuminuria due to type 2 diabetes mellitus ([place])  ? Relevant Medications  ? pravastatin (PRAVACHOL) 20 MG tablet  ? lisinopril (ZESTRIL) 2.5 MG tablet  ? Controlled diabetes mellitus type 2 with complications ([place]) - Primary  ?  Doing really well with current regimen.  A1c down to 6.6.  We discussed continuing to work on food choices and regular exercise.  My goal would be to consider working up on the Trulicity to maximum dose of 4.5 so that we can hopefully switch the Xigduo to just plain metformin and get him off of to branded medications.  Recently just filled a 30-monthsupply of the Trulicity.  Okay to just give me a call and we can always bump  up the dose before the next refill.  Plan to follow-up in 3 to 4 months ? ?Lab Results  ?Component Value Date  ? HGBA1C 6.6 (A) 09/22/2021  ? ? ?  ?  ? Relevant Medications  ? pravastatin (PRAVACHOL) 20 MG tablet  ? lisinopril (ZESTRIL) 2.5 MG tablet  ? Other Relevant Orders  ? POCT glycosylated hemoglobin (Hb A1C) (Completed)  ? POCT UA - Microalbumin (Completed)  ? ?Other Visit Diagnoses   ? ? Rash      ? Relevant Orders  ? Fungal stain  ? ?  ? ? ?Rash-consider fungal rash versus dyshidrotic eczema.  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 ? ?Return in about 3 months (around 12/23/2021) for bp/dm.  ? ? ?Beatrice Lecher, MD ? ?

## 2021-09-24 LAB — FUNGAL STAIN
MICRO NUMBER:: 13338128
SPECIMEN QUALITY:: ADEQUATE

## 2021-09-24 MED ORDER — TERBINAFINE HCL 1 % EX CREA: 1.0000 "application " | TOPICAL_CREAM | Freq: Two times a day (BID) | CUTANEOUS | 0 refills | Status: DC

## 2021-09-24 NOTE — Progress Notes (Signed)
Benjamin Wright, skin scraping came back positive for fungal elements.  Some get a send over topical cream to use please make sure to use for at least 2 weeks.  Once the rash is completely gone you may need to use for 1 more week so it may take 3 weeks total treatment.

## 2021-09-24 NOTE — Addendum Note (Signed)
Addended by: Beatrice Lecher D on: 09/24/2021 03:54 PM ? ? Modules accepted: Orders ? ?

## 2021-10-29 ENCOUNTER — Other Ambulatory Visit: Payer: Self-pay | Admitting: Sports Medicine

## 2021-10-29 DIAGNOSIS — G8929 Other chronic pain: Secondary | ICD-10-CM

## 2021-12-08 ENCOUNTER — Encounter: Payer: Self-pay | Admitting: Family Medicine

## 2021-12-09 MED ORDER — TRULICITY 3 MG/0.5ML ~~LOC~~ SOAJ: 4.5000 mg | Freq: Every day | SUBCUTANEOUS | 0 refills | Status: DC

## 2021-12-15 MED ORDER — TRULICITY 4.5 MG/0.5ML ~~LOC~~ SOAJ: 4.5000 mg | SUBCUTANEOUS | 0 refills | Status: DC

## 2021-12-15 NOTE — Addendum Note (Signed)
Addended by: Beatrice Lecher D on: 12/15/2021 05:43 PM   Modules accepted: Orders

## 2021-12-26 ENCOUNTER — Other Ambulatory Visit: Payer: Self-pay

## 2021-12-26 DIAGNOSIS — E118 Type 2 diabetes mellitus with unspecified complications: Secondary | ICD-10-CM

## 2021-12-26 MED ORDER — XIGDUO XR 10-1000 MG PO TB24: 1.0000 | ORAL_TABLET | Freq: Every day | ORAL | 0 refills | Status: DC

## 2021-12-30 ENCOUNTER — Ambulatory Visit: Payer: Federal, State, Local not specified - PPO | Admitting: Family Medicine

## 2021-12-30 ENCOUNTER — Encounter: Payer: Self-pay | Admitting: Family Medicine

## 2021-12-30 VITALS — BP 128/78 | HR 84 | Ht 69.0 in | Wt 204.0 lb

## 2021-12-30 DIAGNOSIS — E1169 Type 2 diabetes mellitus with other specified complication: Secondary | ICD-10-CM | POA: Diagnosis not present

## 2021-12-30 DIAGNOSIS — E785 Hyperlipidemia, unspecified: Secondary | ICD-10-CM

## 2021-12-30 DIAGNOSIS — E118 Type 2 diabetes mellitus with unspecified complications: Secondary | ICD-10-CM

## 2021-12-30 LAB — POCT GLYCOSYLATED HEMOGLOBIN (HGB A1C): HbA1c POC (<> result, manual entry): 7 % (ref 4.0–5.6)

## 2021-12-30 MED ORDER — XIGDUO XR 10-1000 MG PO TB24: 1.0000 | ORAL_TABLET | Freq: Every day | ORAL | 0 refills | Status: DC

## 2021-12-30 NOTE — Progress Notes (Signed)
Established Patient Office Visit  Subjective   Patient ID: Benjamin Wright, male    DOB: [birth year]  Age: 61 y.o. MRN: [id number]  Chief Complaint  Patient presents with   Follow-up    HPI  Diabetes - no hypoglycemic events. No wounds or sores that are not healing well. No increased thirst or urination. [name]. Taking medications as prescribed without any side effects.  Last A1c looked fantastic.  He is taking a couple of vacations this summer and admits has not been eating or exercising during that time.  He just completed his 3 mg Trulicity and will start the 4.5 mg next week.  He did start back to the gym on Friday and has been having a lot of bilateral shoulder pain.  He has been using the oculus.  He is probably going to get back in with Dr. Darene Wright our sports med doc for that left shoulder.    ROS    Objective:     BP 128/78   Pulse 84   Ht '5\' 9"'$  (1.753 m)   Wt 204 lb (92.5 kg)   SpO2 98%   BMI 30.13 kg/m    Physical Exam Constitutional:      Appearance: He is well-developed.  HENT:     Head: Normocephalic and atraumatic.  Cardiovascular:     Rate and Rhythm: Normal rate and regular rhythm.     Heart sounds: Normal heart sounds.  Pulmonary:     Effort: Pulmonary effort is normal.     Breath sounds: Normal breath sounds.  Skin:    General: Skin is warm and dry.  Neurological:     Mental Status: He is alert and oriented to person, place, and time.  Psychiatric:        Behavior: Behavior normal.      Results for orders placed or performed in visit on 12/30/21  POCT glycosylated hemoglobin (Hb A1C)  Result Value Ref Range   Hemoglobin A1C     HbA1c POC (<> result, manual entry) 7.0 4.0 - 5.6 %   HbA1c, POC (prediabetic range)     HbA1c, POC (controlled diabetic range)        The 10-year ASCVD risk score (Arnett DK, et al., 2019) is: 33.7%    Assessment & Plan:   Problem List Items Addressed This Visit       Endocrine    Hyperlipidemia associated with type 2 diabetes mellitus ([place])    Due to recheck lipid panel to make sure at goal.  Continue pravastatin 20 mg.      Relevant Medications   Dapagliflozin-metFORMIN HCl ER (XIGDUO XR) 02-999 MG TB24   Other Relevant Orders   POCT glycosylated hemoglobin (Hb A1C) (Completed)   COMPLETE METABOLIC PANEL WITH GFR   Lipid Panel w/reflex Direct LDL   Controlled diabetes mellitus type 2 with complications ([facility]) - Primary    A1C is up from 7.0.  Encouraged him to get back on track with diet and exercise.  I think he will get it back down to where it was previously without any changes today.  He we will go ahead and bump up to 4.5 mg on the Trulicity for his next injection he has been on 3 mg.  Otherwise tolerating it well without any major side effects.      Relevant Medications   Dapagliflozin-metFORMIN HCl ER (XIGDUO XR) 02-999 MG TB24   Other Relevant Orders   POCT glycosylated hemoglobin (Hb A1C) (Completed)  COMPLETE METABOLIC PANEL WITH GFR   Lipid Panel w/reflex Direct LDL    Follow-up with sports medicine for bilateral shoulder pain.  Return in about 4 months (around 05/01/2022) for Diabetes follow-up.    Benjamin Lecher, MD

## 2021-12-30 NOTE — Assessment & Plan Note (Signed)
Due to recheck lipid panel to make sure at goal.  Continue pravastatin 20 mg.

## 2021-12-30 NOTE — Assessment & Plan Note (Addendum)
A1C is up from 7.0.  Encouraged him to get back on track with diet and exercise.  I think he will get it back down to where it was previously without any changes today.  He we will go ahead and bump up to 4.5 mg on the Trulicity for his next injection he has been on 3 mg.  Otherwise tolerating it well without any major side effects.

## 2022-01-01 ENCOUNTER — Telehealth: Payer: Self-pay | Admitting: Family Medicine

## 2022-01-01 NOTE — Telephone Encounter (Signed)
Spoke with patient and he states he will contact them to get colonoscopy scheduled.

## 2022-01-01 NOTE — Telephone Encounter (Signed)
Please call patient and let him know that we did confirm that Dr. Hilarie Fredrickson, gastroenterology, did want him to come back in 3 years for repeat colonoscopy based on his biopsy results.  So he is due for repeat colonoscopy.  We can place a referral if he would like or he can call them.

## 2022-01-02 ENCOUNTER — Ambulatory Visit (INDEPENDENT_AMBULATORY_CARE_PROVIDER_SITE_OTHER): Payer: Federal, State, Local not specified - PPO

## 2022-01-02 ENCOUNTER — Ambulatory Visit: Payer: Federal, State, Local not specified - PPO | Admitting: Sports Medicine

## 2022-01-02 DIAGNOSIS — M25512 Pain in left shoulder: Secondary | ICD-10-CM

## 2022-01-02 DIAGNOSIS — G8929 Other chronic pain: Secondary | ICD-10-CM

## 2022-01-02 NOTE — Assessment & Plan Note (Signed)
Pleasant 61 year old male returns, he has chronic shoulder pain, MRI did show some bursal sided cuff tearing and tendinosis of several rotator cuff tendons, as well as bicipital tendinosis. We last did a subacromial and glenohumeral shot, he still has exam findings today consistent with a biceps pain generator, joint pain generator and impingement type symptoms and signs, so we will do both shots again today. Return to see me as needed.

## 2022-01-02 NOTE — Progress Notes (Signed)
    Procedures performed today:    Procedure: Real-time Ultrasound Guided injection of the left subacromial bursa Device: Samsung HS60  Verbal informed consent obtained.  Time-out conducted.  Noted no overlying erythema, induration, or other signs of local infection.  Skin prepped in a sterile fashion.  Local anesthesia: Topical Ethyl chloride.  With sterile technique and under real time ultrasound guidance: Intact rotator cuff noted, 1 cc lidocaine, 1 cc bupivacaine, 1 cc kenalog 40 injected easily. Completed without difficulty  Advised to call if fevers/chills, erythema, induration, drainage, or persistent bleeding.  Images permanently stored and available for review in PACS.  Impression: Technically successful ultrasound guided injection.   Procedure: Real-time Ultrasound Guided injection of the left glenohumeral joint Device: Samsung HS60  Verbal informed consent obtained.  Time-out conducted.  Noted no overlying erythema, induration, or other signs of local infection.  Skin prepped in a sterile fashion.  Local anesthesia: Topical Ethyl chloride.  With sterile technique and under real time ultrasound guidance: Noted arthritic joint, 1 cc Kenalog 40, 2 cc lidocaine, 2 cc bupivacaine injected easily Completed without difficulty  Advised to call if fevers/chills, erythema, induration, drainage, or persistent bleeding.  Images permanently stored and available for review in PACS.  Impression: Technically successful ultrasound guided injection.  Independent interpretation of notes and tests performed by another provider:   None.  Brief History, Exam, Impression, and Recommendations:    Chronic left shoulder pain Benjamin Wright 61 year old male returns, he has chronic shoulder pain, MRI did show some bursal sided cuff tearing and tendinosis of several rotator cuff tendons, as well as bicipital tendinosis. We last did a subacromial and glenohumeral shot, he still has exam findings today  consistent with a biceps pain generator, joint pain generator and impingement type symptoms and signs, so we will do both shots again today. Return to see me as needed.  Chronic process with exacerbation and pharmacologic intervention  ____________________________________________ Gwen Her. Dianah Field, M.D., ABFM., CAQSM., AME. Primary Care and Sports Medicine [facility]  Adjunct Professor of [employer]  Risk manager

## 2022-01-08 ENCOUNTER — Encounter: Payer: Self-pay | Admitting: Family Medicine

## 2022-01-09 ENCOUNTER — Encounter: Payer: Self-pay | Admitting: Internal Medicine

## 2022-01-09 ENCOUNTER — Telehealth: Payer: Self-pay | Admitting: Family Medicine

## 2022-01-09 DIAGNOSIS — E785 Hyperlipidemia, unspecified: Secondary | ICD-10-CM | POA: Diagnosis not present

## 2022-01-09 DIAGNOSIS — E1169 Type 2 diabetes mellitus with other specified complication: Secondary | ICD-10-CM | POA: Diagnosis not present

## 2022-01-09 DIAGNOSIS — E118 Type 2 diabetes mellitus with unspecified complications: Secondary | ICD-10-CM | POA: Diagnosis not present

## 2022-01-09 DIAGNOSIS — Z1211 Encounter for screening for malignant neoplasm of colon: Secondary | ICD-10-CM

## 2022-01-09 NOTE — Telephone Encounter (Signed)
Jenny Reichmann how booked out are they?  We can let him know and we can either place a referal there or [facility]

## 2022-01-09 NOTE — Telephone Encounter (Signed)
Patient stopped in to have lab work done and was inquiring about wanting a colonoscopy done.  Annitta Needs, MD Address: [address], [place] [address] Phone: [phone]

## 2022-01-12 NOTE — Progress Notes (Signed)
Hi Tom, glucose was high at 184.  Kidney function was stable.  Liver function is normal.  Triglycerides were up a little bit but better than last year.  Continue to work on [employer] and regular exercise.

## 2022-01-12 NOTE — Addendum Note (Signed)
Addended by: Fonnie Mu on: 01/12/2022 05:00 PM   Modules accepted: Orders

## 2022-01-14 LAB — COMPLETE METABOLIC PANEL WITH GFR
AG Ratio: 2.1 (calc) (ref 1.0–2.5)
ALT: 16 U/L (ref 9–46)
AST: 14 U/L (ref 10–35)
Albumin: 4.4 g/dL (ref 3.6–5.1)
Alkaline phosphatase (APISO): 71 U/L (ref 35–144)
BUN/Creatinine Ratio: 24 (calc) — ABNORMAL HIGH (ref 6–22)
BUN: 26 mg/dL — ABNORMAL HIGH (ref 7–25)
CO2: 26 mmol/L (ref 20–32)
Calcium: 9.5 mg/dL (ref 8.6–10.3)
Chloride: 100 mmol/L (ref 98–110)
Creat: 1.08 mg/dL (ref 0.70–1.35)
Globulin: 2.1 g/dL (calc) (ref 1.9–3.7)
Glucose, Bld: 184 mg/dL — ABNORMAL HIGH (ref 65–99)
Potassium: 4.7 mmol/L (ref 3.5–5.3)
Sodium: 139 mmol/L (ref 135–146)
Total Bilirubin: 0.8 mg/dL (ref 0.2–1.2)
Total Protein: 6.5 g/dL (ref 6.1–8.1)
eGFR: 77 mL/min/{1.73_m2} (ref >=60)

## 2022-01-14 LAB — LIPID PANEL W/REFLEX DIRECT LDL
Cholesterol: 141 mg/dL (ref <200)
HDL: 34 mg/dL — ABNORMAL LOW (ref >=40)
LDL Cholesterol (Calc): 80 mg/dL (calc)
Non-HDL Cholesterol (Calc): 107 mg/dL (calc) (ref <130)
Total CHOL/HDL Ratio: 4.1 (calc) (ref <5.0)
Triglycerides: 178 mg/dL — ABNORMAL HIGH (ref <150)

## 2022-01-14 LAB — SPECIMEN COMPROMISED

## 2022-02-09 ENCOUNTER — Ambulatory Visit (AMBULATORY_SURGERY_CENTER): Payer: Federal, State, Local not specified - PPO | Admitting: *Deleted

## 2022-02-09 VITALS — Ht 69.0 in | Wt 202.2 lb

## 2022-02-09 DIAGNOSIS — Z86010 Personal history of colonic polyps: Secondary | ICD-10-CM

## 2022-02-09 MED ORDER — SUTAB 1479-225-188 MG PO TABS: 1.0000 | ORAL_TABLET | Freq: Once | ORAL | 0 refills | Status: AC

## 2022-02-09 NOTE — Progress Notes (Signed)
No egg or soy allergy known to patient  No issues known to pt with past sedation with any surgeries or procedures Patient denies ever being told they had issues or difficulty with intubation  No FH of Malignant Hyperthermia Pt is not on diet pills Pt is not on  home 02  Pt is not on blood thinners  Pt denies issues with constipation  Pt encouraged to use to use Singlecare or Goodrx to reduce cost  

## 2022-02-21 ENCOUNTER — Other Ambulatory Visit: Payer: Self-pay | Admitting: Family Medicine

## 2022-02-23 ENCOUNTER — Encounter: Payer: Self-pay | Admitting: Internal Medicine

## 2022-02-23 ENCOUNTER — Ambulatory Visit (AMBULATORY_SURGERY_CENTER): Payer: Federal, State, Local not specified - PPO | Admitting: Internal Medicine

## 2022-02-23 VITALS — BP 126/74 | HR 81 | Temp 98.4°F | Resp 14 | Ht 69.0 in | Wt 202.4 lb

## 2022-02-23 DIAGNOSIS — Z09 Encounter for follow-up examination after completed treatment for conditions other than malignant neoplasm: Secondary | ICD-10-CM | POA: Diagnosis not present

## 2022-02-23 DIAGNOSIS — Z86010 Personal history of colonic polyps: Secondary | ICD-10-CM | POA: Diagnosis not present

## 2022-02-23 DIAGNOSIS — D123 Benign neoplasm of transverse colon: Secondary | ICD-10-CM | POA: Diagnosis not present

## 2022-02-23 DIAGNOSIS — Z1211 Encounter for screening for malignant neoplasm of colon: Secondary | ICD-10-CM | POA: Diagnosis not present

## 2022-02-23 MED ORDER — SODIUM CHLORIDE 0.9 % IV SOLN: 500.0000 mL | INTRAVENOUS | Status: DC

## 2022-02-23 NOTE — Op Note (Signed)
[name] Procedure Date: 02/23/2022 11:32 AM MRN: [id number] Endoscopist: Jerene Bears , MD Age: 61 Referring MD:  Date of Birth: 12-Jun-1960 Gender: Male Account #: [id number] Procedure:                Colonoscopy Indications:              High risk colon cancer surveillance: Personal                            history of adenomas and SSPs, Last colonoscopy:                            July 2020 (2 adenomas and 2 SSPs) Medicines:                Monitored Anesthesia Care Procedure:                Pre-Anesthesia Assessment:                           - Prior to the procedure, a History and Physical                            was performed, and patient medications and                            allergies were reviewed. The patient's tolerance of                            previous anesthesia was also reviewed. The risks                            and benefits of the procedure and the sedation                            options and risks were discussed with the patient.                            All questions were answered, and informed consent                            was obtained. Prior Anticoagulants: The patient has                            taken no previous anticoagulant or antiplatelet                            agents. ASA Grade Assessment: II - A patient with                            mild systemic disease. After reviewing the risks                            and benefits, the patient was deemed in  satisfactory condition to undergo the procedure.                           After obtaining informed consent, the colonoscope                            was passed under direct vision. Throughout the                            procedure, the patient's blood pressure, pulse, and                            oxygen saturations were monitored continuously. The                            CF [id number] #[id number] was introduced  through the anus                            and advanced to the cecum, identified by                            appendiceal orifice and ileocecal valve. The                            colonoscopy was performed without difficulty. The                            patient tolerated the procedure well. The quality                            of the bowel preparation was good. The ileocecal                            valve, appendiceal orifice, and rectum were                            photographed. Scope In: 11:39:10 AM Scope Out: 12:00:01 PM Scope Withdrawal Time: 0 hours 16 minutes 37 seconds  Total Procedure Duration: 0 hours 20 minutes 51 seconds  Findings:                 The digital rectal exam was normal.                           A 3 mm polyp was found in the transverse colon. The                            polyp was sessile. The polyp was removed with a                            cold snare. Resection and retrieval were complete.                           Multiple small-mouthed diverticula were found in  the sigmoid colon.                           The exam was otherwise without abnormality on                            direct and retroflexion views. Complications:            No immediate complications. Estimated Blood Loss:     Estimated blood loss: none. Impression:               - One 3 mm polyp in the transverse colon, removed                            with a cold snare. Resected and retrieved.                           - Mild diverticulosis in the sigmoid colon.                           - The examination was otherwise normal on direct                            and retroflexion views. Recommendation:           - Patient has a contact number available for                            emergencies. The signs and symptoms of potential                            delayed complications were discussed with the                            patient. Return to  normal activities tomorrow.                            Written discharge instructions were provided to the                            patient.                           - Resume previous diet.                           - Continue present medications.                           - Await pathology results.                           - Repeat colonoscopy in 5 years for surveillance. Jerene Bears, MD 02/23/2022 12:02:31 PM This report has been signed electronically.

## 2022-02-23 NOTE — Progress Notes (Signed)
Called to room to assist during endoscopic procedure.  Patient ID and intended procedure confirmed with present staff. Received instructions for my participation in the procedure from the performing physician.  

## 2022-02-23 NOTE — Patient Instructions (Signed)
Handout on polyps and diverticulosis given to patient. Await pathology results. Resume previous diet and continue present medications. Repeat colonoscopy for surveillance in 5 years.   YOU HAD AN ENDOSCOPIC PROCEDURE TODAY AT [facility]:   Refer to the procedure report that was given to you for any specific questions about what was found during the examination.  If the procedure report does not answer your questions, please call your gastroenterologist to clarify.  If you requested that your care partner not be given the details of your procedure findings, then the procedure report has been included in a sealed envelope for you to review at your convenience later.  YOU SHOULD EXPECT: Some feelings of bloating in the abdomen. Passage of more gas than usual.  Walking can help get rid of the air that was put into your GI tract during the procedure and reduce the bloating. If you had a lower endoscopy (such as a colonoscopy or flexible sigmoidoscopy) you may notice spotting of blood in your stool or on the toilet paper. If you underwent a bowel prep for your procedure, you may not have a normal bowel movement for a few days.  Please Note:  You might notice some irritation and congestion in your nose or some drainage.  This is from the oxygen used during your procedure.  There is no need for concern and it should clear up in a day or so.  SYMPTOMS TO REPORT IMMEDIATELY:  Following lower endoscopy (colonoscopy or flexible sigmoidoscopy):  Excessive amounts of blood in the stool  Significant tenderness or worsening of abdominal pains  Swelling of the abdomen that is new, acute  Fever of 100F or higher  For urgent or emergent issues, a gastroenterologist can be reached at any hour by calling [phone]. Do not use MyChart messaging for urgent concerns.    DIET:  We do recommend a small meal at first, but then you may proceed to your regular diet.  Drink plenty of fluids but you  should avoid alcoholic beverages for 24 hours.  ACTIVITY:  You should plan to take it easy for the rest of today and you should NOT DRIVE or use heavy machinery until tomorrow (because of the sedation medicines used during the test).    FOLLOW UP: Our staff will call the number listed on your records the next business day following your procedure.  We will call around 7:15- 8:00 am to check on you and address any questions or concerns that you may have regarding the information given to you following your procedure. If we do not reach you, we will leave a message.     If any biopsies were taken you will be contacted by phone or by letter within the next 1-3 weeks.  Please call us at [phone] if you have not heard about the biopsies in 3 weeks.    SIGNATURES/CONFIDENTIALITY: You and/or your care partner have signed paperwork which will be entered into your electronic medical record.  These signatures attest to the fact that that the information above on your After Visit Summary has been reviewed and is understood.  Full responsibility of the confidentiality of this discharge information lies with you and/or your care-partner.

## 2022-02-23 NOTE — Progress Notes (Signed)
To pacu, VSS. Report to [email or web address].tb 

## 2022-02-23 NOTE — Progress Notes (Signed)
GASTROENTEROLOGY PROCEDURE H&P NOTE   Primary Care Physician: Hali Marry, MD    Reason for Procedure:   Hx adenoma and SSP at last colonoscopy   Plan:    colonoscopy  Patient is appropriate for endoscopic procedure(s) in the ambulatory ([facility]) setting.  The nature of the procedure, as well as the risks, benefits, and alternatives were carefully and thoroughly reviewed with the patient. Ample time for discussion and questions allowed. The patient understood, was satisfied, and agreed to proceed.     HPI: JAMANI BEARCE is a 61 y.o. male who presents for surveillance colonoscopy.  Medical history as below.  Tolerated the prep.  No recent chest pain or shortness of breath.  No abdominal pain today.  Past Medical History:  Diagnosis Date   Arthritis    Atrial fibrillation ([place])    Diabetes mellitus without complication (HCC)    GERD (gastroesophageal reflux disease)    Hyperlipidemia    Hypertension     Past Surgical History:  Procedure Laterality Date   COLONOSCOPY     cyst removal from ankle     finger reattachment     VASECTOMY      Prior to Admission medications   Medication Sig Start Date End Date Taking? Authorizing Provider  aspirin EC 81 MG tablet Take 81 mg by mouth daily.   Yes [provider]  Dapagliflozin-metFORMIN HCl ER (XIGDUO XR) 02-999 MG TB24 Take 1 tablet by mouth daily. 12/30/21  Yes Hali Marry, MD  lisinopril (ZESTRIL) 2.5 MG tablet Take 1 tablet (2.5 mg total) by mouth daily. 09/22/21  Yes Hali Marry, MD  Multiple Vitamin (MULTIVITAMIN PO) Take by mouth daily.   Yes [provider]  pravastatin (PRAVACHOL) 20 MG tablet TAKE 1 TABLET BY MOUTH EVERYDAY AT BEDTIME 09/22/21  Yes Metheney, Rene Kocher, MD  AMBULATORY NON FORMULARY MEDICATION Medication Name: [facility]    [provider]  Dulaglutide (TRULICITY) 4.5 WE/3.1VQ SOPN Inject 4.5 mg as directed once a week. 12/15/21   Hali Marry,  MD  pantoprazole (PROTONIX) 40 MG tablet TAKE 1 TABLET BY MOUTH EVERYDAY AT BEDTIME Patient not taking: Reported on 02/09/2022 06/25/21   Hali Marry, MD    Current Outpatient Medications  Medication Sig Dispense Refill   aspirin EC 81 MG tablet Take 81 mg by mouth daily.     Dapagliflozin-metFORMIN HCl ER (XIGDUO XR) 02-999 MG TB24 Take 1 tablet by mouth daily. 90 tablet 0   lisinopril (ZESTRIL) 2.5 MG tablet Take 1 tablet (2.5 mg total) by mouth daily. 90 tablet 3   Multiple Vitamin (MULTIVITAMIN PO) Take by mouth daily.     pravastatin (PRAVACHOL) 20 MG tablet TAKE 1 TABLET BY MOUTH EVERYDAY AT BEDTIME 90 tablet 3   AMBULATORY NON FORMULARY MEDICATION Medication Name: LIVONGO     Dulaglutide (TRULICITY) 4.5 MG/8.6PY SOPN Inject 4.5 mg as directed once a week. 6 mL 0   pantoprazole (PROTONIX) 40 MG tablet TAKE 1 TABLET BY MOUTH EVERYDAY AT BEDTIME (Patient not taking: Reported on 02/09/2022) 30 tablet 3   Current Facility-Administered Medications  Medication Dose Route Frequency Provider Last Rate Last Admin   0.9 %  sodium chloride infusion  500 mL Intravenous Continuous Tanita Palinkas, Lajuan Lines, MD        Allergies as of 02/23/2022   (No Known Allergies)    Family History  Problem Relation Age of Onset   Diabetes Mother    Heart attack Father    Stomach  cancer Maternal Grandfather    Colon cancer Neg Hx    Esophageal cancer Neg Hx    Rectal cancer Neg Hx     Social History   Socioeconomic History   Marital status: Married    Spouse name: [name]   Number of children: Not on file   Years of education: Not on file   Highest education level: Not on file  Occupational History   Occupation: Theme park manager  Tobacco Use   Smoking status: Some Days    Types: Pipe, Cigars   Smokeless tobacco: Never   Tobacco comments:    Once a month pipe, cigars; last cigar year ago per pt  Vaping Use   Vaping Use: Never used  Substance and Sexual Activity   Alcohol use: Yes    Comment: social    Drug use: Never   Sexual activity: Yes    Partners: Female  Other Topics Concern   Not on file  Social History Narrative   Not on file   Social Determinants of Health   Financial Resource Strain: Not on file  Food Insecurity: Not on file  Transportation Needs: Not on file  Physical Activity: Not on file  Stress: Not on file  Social Connections: Not on file  Intimate Partner Violence: Not on file    Physical Exam: Vital signs in last 24 hours: '@BP'$  133/76   Pulse 93   Temp 98.4 F (36.9 C)   Ht '5\' 9"'$  (1.753 m)   Wt 202 lb 6.4 oz (91.8 kg)   SpO2 96%   BMI 29.89 kg/m  GEN: NAD EYE: Sclerae anicteric ENT: MMM CV: Non-tachycardic Pulm: CTA b/l GI: Soft, NT/ND NEURO:  Alert & Oriented x 3   Zenovia Jarred, MD [facility]  02/23/2022 11:27 AM

## 2022-02-23 NOTE — Progress Notes (Signed)
Pt's states no medical or surgical changes since previsit or office visit. 

## 2022-02-24 ENCOUNTER — Telehealth: Payer: Self-pay | Admitting: *Deleted

## 2022-02-24 NOTE — Telephone Encounter (Signed)
Follow up call attempt.  Unable to leave VM.

## 2022-02-26 ENCOUNTER — Encounter: Payer: Self-pay | Admitting: Internal Medicine

## 2022-05-01 ENCOUNTER — Ambulatory Visit: Payer: Federal, State, Local not specified - PPO | Admitting: Family Medicine

## 2022-05-01 ENCOUNTER — Encounter: Payer: Self-pay | Admitting: Family Medicine

## 2022-05-01 VITALS — BP 124/78 | HR 96 | Ht 69.0 in | Wt 203.0 lb

## 2022-05-01 DIAGNOSIS — E118 Type 2 diabetes mellitus with unspecified complications: Secondary | ICD-10-CM

## 2022-05-01 DIAGNOSIS — Z23 Encounter for immunization: Secondary | ICD-10-CM

## 2022-05-01 LAB — POCT GLYCOSYLATED HEMOGLOBIN (HGB A1C): Hemoglobin A1C: 6.6 % — AB (ref 4.0–5.6)

## 2022-05-01 NOTE — Progress Notes (Signed)
   Established Patient Office Visit  Subjective   Patient ID: IVIS HENNEMAN, male    DOB: 02/27/1961  Age: 61 y.o. MRN: [id number]  Chief Complaint  Patient presents with   Diabetes    Diabetes Pertinent negatives for diabetes include no chest pain.    Patient is 61 year old male presenting to clinic for diabetic follow up. Blood sugars have been in the 150s in the mornings. He has tried to watch his diet and only eating pasta once every few months. Denies any hypoglycemic events, urinary changes, or increased thirst. No open wounds or sores. Tolerating all medications. No new concerns.  He continues to see Dr. Darene Lamer shoulder pain. His last steroid injection was in August.     Review of Systems  Respiratory:  Negative for shortness of breath.   Cardiovascular:  Negative for chest pain.  Musculoskeletal:  Positive for joint pain.  All other systems reviewed and are negative.     Objective:     BP 124/78 (BP Location: Left Arm, Patient Position: Sitting, Cuff Size: Large)   Pulse 96   Ht '5\' 9"'$  (1.753 m)   Wt 92.1 kg   SpO2 96%   BMI 29.98 kg/m    Physical Exam Constitutional:      Appearance: Normal appearance.  Cardiovascular:     Rate and Rhythm: Normal rate and regular rhythm.  Pulmonary:     Effort: Pulmonary effort is normal.     Breath sounds: Normal breath sounds.  Neurological:     General: No focal deficit present.     Mental Status: He is alert.      Results for orders placed or performed in visit on 05/01/22  POCT glycosylated hemoglobin (Hb A1C)  Result Value Ref Range   Hemoglobin A1C 6.6 (A) 4.0 - 5.6 %   HbA1c POC (<> result, manual entry)     HbA1c, POC (prediabetic range)     HbA1c, POC (controlled diabetic range)        The 10-year ASCVD risk score (Arnett DK, et al., 2019) is: 30.2%    Assessment & Plan:   Problem List Items Addressed This Visit       Endocrine   Controlled diabetes mellitus type 2 with complications ([place]) -  Primary   Relevant Orders   POCT glycosylated hemoglobin (Hb A1C) (Completed)   Other Visit Diagnoses     Need for immunization against influenza       Relevant Orders   Flu Vaccine QUAD 55moIM (Fluarix, Fluzone & Alfiuria Quad PF) (Completed)      Flu shot given today.  Diabetes A1C stable. Continue current medication regimen. Encouraged to maintain healthy diet and physical activity throughout the week. Encouraged to schedule eye exam.  Follow up in 4 months.  Return in about 4 months (around 08/31/2022) for DM.    KDorian Heckle Student-PA

## 2022-05-01 NOTE — Progress Notes (Signed)
I personally interviewed and examined the patient.  Agree with the assessment and plan below.

## 2022-05-27 ENCOUNTER — Other Ambulatory Visit: Payer: Self-pay | Admitting: Family Medicine

## 2022-05-27 DIAGNOSIS — E118 Type 2 diabetes mellitus with unspecified complications: Secondary | ICD-10-CM

## 2022-06-04 ENCOUNTER — Other Ambulatory Visit: Payer: Self-pay | Admitting: Family Medicine

## 2022-06-11 ENCOUNTER — Other Ambulatory Visit: Payer: Self-pay | Admitting: Family Medicine

## 2022-06-11 DIAGNOSIS — E118 Type 2 diabetes mellitus with unspecified complications: Secondary | ICD-10-CM

## 2022-07-07 LAB — HM DIABETES EYE EXAM

## 2022-07-09 ENCOUNTER — Encounter: Payer: Self-pay | Admitting: Family Medicine

## 2022-08-24 ENCOUNTER — Encounter: Payer: Self-pay | Admitting: Family Medicine

## 2022-08-27 ENCOUNTER — Telehealth: Payer: Self-pay

## 2022-08-27 NOTE — Telephone Encounter (Signed)
Initiated Prior authorization [id number] Pro-metFORMIN ER 10-1000MG  er tablets Via: Covermymeds Case/Key:[id number] Status: Pending as of 08/27/22 Reason:Approved through 08/27/23 Notified Pt via: Mychart

## 2022-09-04 ENCOUNTER — Encounter: Payer: Self-pay | Admitting: Family Medicine

## 2022-09-04 ENCOUNTER — Other Ambulatory Visit (HOSPITAL_BASED_OUTPATIENT_CLINIC_OR_DEPARTMENT_OTHER): Payer: Self-pay

## 2022-09-04 ENCOUNTER — Ambulatory Visit: Payer: Federal, State, Local not specified - PPO | Admitting: Family Medicine

## 2022-09-04 VITALS — BP 132/81 | HR 93 | Ht 69.0 in | Wt 204.0 lb

## 2022-09-04 DIAGNOSIS — R809 Proteinuria, unspecified: Secondary | ICD-10-CM

## 2022-09-04 DIAGNOSIS — N029 Recurrent and persistent hematuria with unspecified morphologic changes: Secondary | ICD-10-CM | POA: Diagnosis not present

## 2022-09-04 DIAGNOSIS — E1129 Type 2 diabetes mellitus with other diabetic kidney complication: Secondary | ICD-10-CM

## 2022-09-04 DIAGNOSIS — Z125 Encounter for screening for malignant neoplasm of prostate: Secondary | ICD-10-CM | POA: Diagnosis not present

## 2022-09-04 DIAGNOSIS — E118 Type 2 diabetes mellitus with unspecified complications: Secondary | ICD-10-CM | POA: Diagnosis not present

## 2022-09-04 LAB — POCT GLYCOSYLATED HEMOGLOBIN (HGB A1C): Hemoglobin A1C: 7.7 % — AB (ref 4.0–5.6)

## 2022-09-04 LAB — POCT UA - MICROALBUMIN
Creatinine, POC: 50 mg/dL
Microalbumin Ur, POC: 30 mg/L

## 2022-09-04 MED ORDER — RYBELSUS 7 MG PO TABS
7.0000 mg | ORAL_TABLET | Freq: Every day | ORAL | 1 refills | Status: DC
  Filled 2022-10-06: qty 30, 30d supply, fill #0

## 2022-09-04 MED ORDER — TRULICITY 4.5 MG/0.5ML ~~LOC~~ SOAJ
4.5000 mg | SUBCUTANEOUS | 1 refills | Status: DC
  Filled 2022-09-04: qty 2, 28d supply, fill #0

## 2022-09-04 NOTE — Progress Notes (Signed)
Stable on and on ACE for mild proteinuria.

## 2022-09-04 NOTE — Assessment & Plan Note (Signed)
Is a history of benign paroxysmal hematuria.  We discussed warning signs and symptoms for when it might be more concerning such as fever fatigue night sweats abnormal weight loss or discomfort.  He will let me know if that happens.

## 2022-09-04 NOTE — Progress Notes (Addendum)
Established Patient Office Visit  Subjective   Patient ID: Benjamin Wright, male    DOB: [birth year]  Age: 62 y.o. MRN: [id number]  Chief Complaint  Patient presents with   Diabetes    HPI  Diabetes - no hypoglycemic events. No wounds or sores that are not healing well. No increased thirst or urination. [name].  Glucose lately has been running in the 140s.  Previously was running in the 160s to 170s.  Taking medications as prescribed without any side effects.  Using pepcid for his GERD.  He states he started to get reflux in the middle the night again and so ended up starting some famotidine he says so far it has been helping he also uses occasionally Tums.  He also wanted to let me know that he had another episode of hematuria in October that lasted about a week and then just a single episode that happened about 2 weeks ago.  He has had full workup by urology including cystoscopy and they feel like it is benign hematuria.     ROS     Objective:     BP 132/81   Pulse 93   Ht  (1.753 m)   Wt 204 lb (92.5 kg)   SpO2 96%   BMI 30.13 kg/m    Physical Exam Constitutional:      Appearance: He is well-developed.  HENT:     Head: Normocephalic and atraumatic.  Cardiovascular:     Rate and Rhythm: Normal rate and regular rhythm.     Heart sounds: Normal heart sounds.  Pulmonary:     Effort: Pulmonary effort is normal.     Breath sounds: Normal breath sounds.  Skin:    General: Skin is warm and dry.  Neurological:     Mental Status: He is alert and oriented to person, place, and time.  Psychiatric:        Behavior: Behavior normal.      Results for orders placed or performed in visit on 09/04/22  POCT glycosylated hemoglobin (Hb A1C)  Result Value Ref Range   Hemoglobin A1C 7.7 (A) 4.0 - 5.6 %   HbA1c POC (<> result, manual entry)     HbA1c, POC (prediabetic range)     HbA1c, POC (controlled diabetic range)    POCT UA - Microalbumin   Result Value Ref Range   Microalbumin Ur, POC 30 mg/L   Creatinine, POC 50 mg/dL   Albumin/Creatinine Ratio, Urine, POC 30-300       The 10-year ASCVD risk score (Arnett DK, et al., 2019) is: 34.6%    Assessment & Plan:   Problem List Items Addressed This Visit       Endocrine   Microalbuminuria due to type 2 diabetes mellitus    Continue lisinopril 2.5 mg.      Relevant Medications   Semaglutide (RYBELSUS) 7 MG TABS   Controlled diabetes mellitus type 2 with complications - Primary    A1C is uncontrolled it went from 6.6 up to 7.7.  He has been getting fasting blood sugars in the 140s.  Discussed maybe switching to something little stronger such as set bound instead of the Trulicity especially since he is having a hard time getting the Trulicity right now.  Though it may require prior authorization with the insurance.  Continue to work on [employer] and regular exercise as well.  Zepbound not covered by insurance.  It looks like Ozempic is but it is  on backorder as well and very difficult to get.  So we may try switching to Rybelsus instead and see if he does well with that.  New prescription sent to pharmacy.  Similar symptoms and side effects      Relevant Medications   Semaglutide (RYBELSUS) 7 MG TABS   Other Relevant Orders   POCT glycosylated hemoglobin (Hb A1C) (Completed)   POCT UA - Microalbumin (Completed)   COMPLETE METABOLIC PANEL WITH GFR   PSA     Genitourinary   Benign hematuria    Is a history of benign paroxysmal hematuria.  We discussed warning signs and symptoms for when it might be more concerning such as fever fatigue night sweats abnormal weight loss or discomfort.  He will let me know if that happens.      Other Visit Diagnoses     Screening for prostate cancer       Relevant Orders   PSA       Return in about 4 months (around 01/04/2023) for Diabetes follow-up.    Nani Gasser, MD

## 2022-09-04 NOTE — Assessment & Plan Note (Addendum)
A1C is uncontrolled it went from 6.6 up to 7.7.  He has been getting fasting blood sugars in the 140s.  Discussed maybe switching to something little stronger such as set bound instead of the Trulicity especially since he is having a hard time getting the Trulicity right now.  Though it may require prior authorization with the insurance.  Continue to work on [employer] and regular exercise as well.  Zepbound not covered by insurance.  It looks like Ozempic is but it is on backorder as well and very difficult to get.  So we may try switching to Rybelsus instead and see if he does well with that.  New prescription sent to pharmacy.  Similar symptoms and side effects

## 2022-09-04 NOTE — Assessment & Plan Note (Signed)
Continue lisinopril 2.5 mg

## 2022-09-05 LAB — COMPLETE METABOLIC PANEL WITH GFR
AG Ratio: 1.9 (calc) (ref 1.0–2.5)
ALT: 20 U/L (ref 9–46)
AST: 15 U/L (ref 10–35)
Albumin: 4.4 g/dL (ref 3.6–5.1)
Alkaline phosphatase (APISO): 76 U/L (ref 35–144)
BUN: 20 mg/dL (ref 7–25)
CO2: 26 mmol/L (ref 20–32)
Calcium: 10 mg/dL (ref 8.6–10.3)
Chloride: 103 mmol/L (ref 98–110)
Creat: 1.13 mg/dL (ref 0.70–1.35)
Globulin: 2.3 g/dL (calc) (ref 1.9–3.7)
Glucose, Bld: 209 mg/dL — ABNORMAL HIGH (ref 65–99)
Potassium: 4.5 mmol/L (ref 3.5–5.3)
Sodium: 142 mmol/L (ref 135–146)
Total Bilirubin: 0.4 mg/dL (ref 0.2–1.2)
Total Protein: 6.7 g/dL (ref 6.1–8.1)
eGFR: 73 mL/min/{1.73_m2} (ref >=60)

## 2022-09-05 LAB — PSA: PSA: 1.58 ng/mL (ref <=4.00)

## 2022-09-06 ENCOUNTER — Encounter: Payer: Self-pay | Admitting: Family Medicine

## 2022-09-06 NOTE — Progress Notes (Signed)
Hi Tom, your glucose was over 200. It looks like Zepbound was not covered by the insurance but Ozempic is. But Ozempic is really hard to get right now so I sent over Rybelsus. You would start it when you are due for your next Trulicity shot. Continue to work on [employer] and exercise.

## 2022-09-07 ENCOUNTER — Other Ambulatory Visit (HOSPITAL_BASED_OUTPATIENT_CLINIC_OR_DEPARTMENT_OTHER): Payer: Self-pay

## 2022-09-07 MED ORDER — AZITHROMYCIN 250 MG PO TABS: ORAL_TABLET | ORAL | 0 refills | Status: DC

## 2022-10-05 ENCOUNTER — Other Ambulatory Visit (HOSPITAL_BASED_OUTPATIENT_CLINIC_OR_DEPARTMENT_OTHER): Payer: Self-pay

## 2022-10-06 ENCOUNTER — Other Ambulatory Visit (HOSPITAL_BASED_OUTPATIENT_CLINIC_OR_DEPARTMENT_OTHER): Payer: Self-pay

## 2022-11-01 ENCOUNTER — Encounter: Payer: Self-pay | Admitting: Family Medicine

## 2022-11-09 ENCOUNTER — Other Ambulatory Visit: Payer: Self-pay | Admitting: Family Medicine

## 2022-11-09 ENCOUNTER — Other Ambulatory Visit (HOSPITAL_BASED_OUTPATIENT_CLINIC_OR_DEPARTMENT_OTHER): Payer: Self-pay

## 2022-11-09 MED ORDER — RYBELSUS 7 MG PO TABS
7.0000 mg | ORAL_TABLET | Freq: Every day | ORAL | 1 refills | Status: DC
  Filled 2022-11-09: qty 30, 30d supply, fill #0

## 2022-11-11 MED ORDER — RYBELSUS 14 MG PO TABS: 14.0000 mg | ORAL_TABLET | Freq: Every day | ORAL | 3 refills | Status: DC

## 2022-11-11 NOTE — Telephone Encounter (Signed)
No orders of the defined types were placed in this encounter.  Meds ordered this encounter  Medications   Semaglutide (RYBELSUS) 14 MG TABS    Sig: Take 1 tablet (14 mg total) by mouth daily.    Dispense:  30 tablet    Refill:  3

## 2022-12-24 ENCOUNTER — Other Ambulatory Visit: Payer: Self-pay | Admitting: Family Medicine

## 2022-12-24 DIAGNOSIS — R809 Proteinuria, unspecified: Secondary | ICD-10-CM

## 2022-12-24 DIAGNOSIS — E118 Type 2 diabetes mellitus with unspecified complications: Secondary | ICD-10-CM

## 2023-01-08 ENCOUNTER — Ambulatory Visit: Payer: Federal, State, Local not specified - PPO | Admitting: Family Medicine

## 2023-01-08 ENCOUNTER — Encounter: Payer: Self-pay | Admitting: Family Medicine

## 2023-01-08 ENCOUNTER — Other Ambulatory Visit (HOSPITAL_BASED_OUTPATIENT_CLINIC_OR_DEPARTMENT_OTHER): Payer: Self-pay

## 2023-01-08 VITALS — BP 125/66 | HR 102 | Ht 69.0 in | Wt 203.0 lb

## 2023-01-08 DIAGNOSIS — E118 Type 2 diabetes mellitus with unspecified complications: Secondary | ICD-10-CM

## 2023-01-08 DIAGNOSIS — E1169 Type 2 diabetes mellitus with other specified complication: Secondary | ICD-10-CM

## 2023-01-08 DIAGNOSIS — Z6829 Body mass index (BMI) 29.0-29.9, adult: Secondary | ICD-10-CM

## 2023-01-08 DIAGNOSIS — E785 Hyperlipidemia, unspecified: Secondary | ICD-10-CM | POA: Diagnosis not present

## 2023-01-08 DIAGNOSIS — E1129 Type 2 diabetes mellitus with other diabetic kidney complication: Secondary | ICD-10-CM | POA: Diagnosis not present

## 2023-01-08 DIAGNOSIS — R809 Proteinuria, unspecified: Secondary | ICD-10-CM

## 2023-01-08 LAB — POCT GLYCOSYLATED HEMOGLOBIN (HGB A1C): Hemoglobin A1C: 7.8 % — AB (ref 4.0–5.6)

## 2023-01-08 NOTE — Assessment & Plan Note (Signed)
Due for daily ACEi.

## 2023-01-08 NOTE — Assessment & Plan Note (Addendum)
Unfortunately the A1c really did not change much.  Though he has been on the 14 mg Rybelsus for almost 2 months.  But he had almost a month where his blood sugars were running in the 200s and he could not quite figure out what was causing it or triggering it but that seems to have resolved itself and they seem to be normalizing again.  So we discussed continuing to work on healthy diet and getting to the gym.  He has been trying to exercise 3 days/week but has missed the last 2 weeks.  We will make any changes today though I did encourage him to eat a protein snack in the evenings.  And then we will see if the A1c improves over the next 90 days.  If not then we will need to make additional adjustments.

## 2023-01-08 NOTE — Assessment & Plan Note (Signed)
Due to recheck lipids. Will et labs at next OV

## 2023-01-08 NOTE — Patient Instructions (Signed)
Try a protein snack at bedtime for the next week and see what your blood sugars are doing in the morning.

## 2023-01-08 NOTE — Progress Notes (Signed)
Established Patient Office Visit  Subjective   Patient ID: Benjamin Wright, male    DOB: August 21, 1960  Age: 62 y.o. MRN: [id number]  Chief Complaint  Patient presents with   Diabetes    HPI  Diabetes - no hypoglycemic events. No wounds or sores that are not healing well. No increased thirst or urination. [name]. Taking medications as prescribed without any side effects.had about 3-4 weeks. Where Kos levels were running in the 200s he could not pinpoint exactly what was going on and could not seem to get it back down.  He said it gradually started trending down now he has been seeing 120s and 130s lately but some days still 150s and 160s.  He usually eats his evening meal early between 5 and 6 PM and then checks his blood sugar around 6 AM.  Hyperlipidemia - tolerating stating well with no myalgias or significant side effects.  Lab Results  Component Value Date   CHOL 141 01/09/2022   HDL 34 (L) 01/09/2022   LDLCALC 80 01/09/2022   TRIG 178 (H) 01/09/2022   CHOLHDL 4.1 01/09/2022        ROS    Objective:     BP 125/66   Pulse (!) 102   Ht 5\' 9"  (1.753 m)   Wt 203 lb (92.1 kg)   SpO2 96%   BMI 29.98 kg/m    Physical Exam Constitutional:      Appearance: He is well-developed.  HENT:     Head: Normocephalic and atraumatic.  Cardiovascular:     Rate and Rhythm: Normal rate and regular rhythm.     Heart sounds: Normal heart sounds.  Pulmonary:     Effort: Pulmonary effort is normal.     Breath sounds: Normal breath sounds.  Skin:    General: Skin is warm and dry.  Neurological:     Mental Status: He is alert and oriented to person, place, and time.  Psychiatric:        Behavior: Behavior normal.     Results for orders placed or performed in visit on 01/08/23  POCT glycosylated hemoglobin (Hb A1C)  Result Value Ref Range   Hemoglobin A1C 7.8 (A) 4.0 - 5.6 %   HbA1c POC (<> result, manual entry)     HbA1c, POC (prediabetic range)      HbA1c, POC (controlled diabetic range)        The 10-year ASCVD risk score (Arnett DK, et al., 2019) is: 32%    Assessment & Plan:   Problem List Items Addressed This Visit       Endocrine   Microalbuminuria due to type 2 diabetes mellitus (HCC)    Due for daily ACEi.       Hyperlipidemia associated with type 2 diabetes mellitus (HCC)    Due to recheck lipids. Will et labs at next OV      Controlled diabetes mellitus type 2 with complications (HCC) - Primary    Unfortunately the A1c really did not change much.  Though he has been on the 14 mg Rybelsus for almost 2 months.  But he had almost a month where his blood sugars were running in the 200s and he could not quite figure out what was causing it or triggering it but that seems to have resolved itself and they seem to be normalizing again.  So we discussed continuing to work on healthy diet and getting to the gym.  He has been trying to exercise  3 days/week but has missed the last 2 weeks.  We will make any changes today though I did encourage him to eat a protein snack in the evenings.  And then we will see if the A1c improves over the next 90 days.  If not then we will need to make additional adjustments.      Relevant Orders   POCT glycosylated hemoglobin (Hb A1C) (Completed)     Other   BMI 29.0-29.9,adult    MI now under 30 which is great.  Again continue to work on healthy food choices and regular exercise adding in some resistance training.       Return in about 3 months (around 04/10/2023) for Diabetes follow-up.   I spent 30 minutes on the day of the encounter to include pre-visit record review, face-to-face time with the patient and post visit ordering of test.   Nani Gasser, MD

## 2023-01-08 NOTE — Assessment & Plan Note (Signed)
MI now under 30 which is great.  Again continue to work on healthy food choices and regular exercise adding in some resistance training.

## 2023-01-27 ENCOUNTER — Other Ambulatory Visit: Payer: Self-pay | Admitting: *Deleted

## 2023-01-27 DIAGNOSIS — R809 Proteinuria, unspecified: Secondary | ICD-10-CM

## 2023-01-27 MED ORDER — LISINOPRIL 2.5 MG PO TABS
2.5000 mg | ORAL_TABLET | Freq: Every day | ORAL | 0 refills | Status: DC
Start: 2023-01-27 — End: 2023-06-16

## 2023-03-03 ENCOUNTER — Other Ambulatory Visit: Payer: Self-pay | Admitting: Family Medicine

## 2023-03-03 DIAGNOSIS — E118 Type 2 diabetes mellitus with unspecified complications: Secondary | ICD-10-CM

## 2023-03-13 ENCOUNTER — Other Ambulatory Visit: Payer: Self-pay | Admitting: Family Medicine

## 2023-03-15 ENCOUNTER — Other Ambulatory Visit: Payer: Self-pay | Admitting: *Deleted

## 2023-03-15 MED ORDER — RYBELSUS 14 MG PO TABS: 14.0000 mg | ORAL_TABLET | Freq: Every day | ORAL | 1 refills | Status: DC

## 2023-03-17 ENCOUNTER — Other Ambulatory Visit: Payer: Self-pay | Admitting: Family Medicine

## 2023-04-15 ENCOUNTER — Ambulatory Visit: Payer: Federal, State, Local not specified - PPO | Admitting: Family Medicine

## 2023-04-15 ENCOUNTER — Encounter: Payer: Self-pay | Admitting: Family Medicine

## 2023-04-15 VITALS — BP 130/78 | HR 83 | Ht 69.0 in | Wt 206.0 lb

## 2023-04-15 DIAGNOSIS — E1129 Type 2 diabetes mellitus with other diabetic kidney complication: Secondary | ICD-10-CM

## 2023-04-15 DIAGNOSIS — E1169 Type 2 diabetes mellitus with other specified complication: Secondary | ICD-10-CM | POA: Diagnosis not present

## 2023-04-15 DIAGNOSIS — E118 Type 2 diabetes mellitus with unspecified complications: Secondary | ICD-10-CM

## 2023-04-15 DIAGNOSIS — R809 Proteinuria, unspecified: Secondary | ICD-10-CM

## 2023-04-15 DIAGNOSIS — Z7984 Long term (current) use of oral hypoglycemic drugs: Secondary | ICD-10-CM

## 2023-04-15 DIAGNOSIS — E785 Hyperlipidemia, unspecified: Secondary | ICD-10-CM | POA: Diagnosis not present

## 2023-04-15 LAB — POCT GLYCOSYLATED HEMOGLOBIN (HGB A1C): Hemoglobin A1C: 8.1 % — AB (ref 4.0–5.6)

## 2023-04-15 NOTE — Progress Notes (Signed)
Established Patient Office Visit  Subjective   Patient ID: Benjamin Wright, male    DOB: [birth year]  Age: 62 y.o. MRN: [id number]  Chief Complaint  Patient presents with   Diabetes   Hyperlipidemia    HPI  Diabetes - no hypoglycemic events. No wounds or sores that are not healing well. No increased thirst or urination. [name]. Taking medications as prescribed without any side effects.glucose running much better.  Typically in the 140s at home.  He has had sugars good is 102 he still had a couple in the 160s but most of them are running in the 140 range whereas they were previously in the 200s so he is hoping his A1c would be much better today.  Hyperlipidemia - tolerating stating well with no myalgias or significant side effects.  Lab Results  Component Value Date   CHOL 141 01/09/2022   HDL 34 (L) 01/09/2022   LDLCALC 80 01/09/2022   TRIG 178 (H) 01/09/2022   CHOLHDL 4.1 01/09/2022         ROS    Objective:     BP 130/78   Pulse 83   Ht 5\' 9"  (1.753 m)   Wt 206 lb (93.4 kg)   SpO2 95%   BMI 30.42 kg/m    Physical Exam Vitals and nursing note reviewed.  Constitutional:      Appearance: Normal appearance.  HENT:     Head: Normocephalic and atraumatic.  Eyes:     Conjunctiva/sclera: Conjunctivae normal.  Cardiovascular:     Rate and Rhythm: Normal rate and regular rhythm.  Pulmonary:     Effort: Pulmonary effort is normal.     Breath sounds: Normal breath sounds.  Skin:    General: Skin is warm and dry.  Neurological:     Mental Status: He is alert.  Psychiatric:        Mood and Affect: Mood normal.      Results for orders placed or performed in visit on 04/15/23  POCT HgB A1C  Result Value Ref Range   Hemoglobin A1C 8.1 (A) 4.0 - 5.6 %   HbA1c POC (<> result, manual entry)     HbA1c, POC (prediabetic range)     HbA1c, POC (controlled diabetic range)        The 10-year ASCVD risk score (Arnett DK, et al., 2019) is:  33.9%    Assessment & Plan:   Problem List Items Addressed This Visit       Endocrine   Microalbuminuria due to type 2 diabetes mellitus (HCC)   Relevant Orders   CMP14+EGFR   Lipid panel   CBC   Hemoglobin A1c   Hyperlipidemia associated with type 2 diabetes mellitus (HCC)    Due for updated lipid panel.  Will get that drawn today.      Relevant Orders   CMP14+EGFR   Lipid panel   CBC   Hemoglobin A1c   Controlled diabetes mellitus type 2 with complications (HCC) - Primary    A1c is elevated at 8.1 which is up from prior 7.8.  Which is surprising based on the fact that his fasting glucoses really have significantly improved.  They are still not quite at goal.  We did discuss that fastings under 130 are what we are shooting for.  He has been on the Rybelsus 14 mg for couple months at this point.  Will get a venue puncture A1c just to verify that the point-of-care is accurate today he  is still taking the dapagliflozin and has not missed any doses.  If the A1c is still elevated we did discuss possibly switching to [facility] for better control.  Also discussed Trelegy is around eating starting with protein and vegetables and eating carbs last.  Continue to work on cutting back on sweets and carbs.      Relevant Orders   POCT HgB A1C (Completed)   CMP14+EGFR   Lipid panel   CBC   Hemoglobin A1c    Return in about 3 months (around 07/16/2023) for Diabetes follow-up.    Nani Gasser, MD

## 2023-04-15 NOTE — Assessment & Plan Note (Signed)
Due for updated lipid panel.  Will get that drawn today.

## 2023-04-15 NOTE — Assessment & Plan Note (Addendum)
A1c is elevated at 8.1 which is up from prior 7.8.  Which is surprising based on the fact that his fasting glucoses really have significantly improved.  They are still not quite at goal.  We did discuss that fastings under 130 are what we are shooting for.  He has been on the Rybelsus 14 mg for couple months at this point.  Will get a venue puncture A1c just to verify that the point-of-care is accurate today he is still taking the dapagliflozin and has not missed any doses.  If the A1c is still elevated we did discuss possibly switching to [facility] for better control.  Also discussed Trelegy is around eating starting with protein and vegetables and eating carbs last.  Continue to work on cutting back on sweets and carbs.

## 2023-04-16 ENCOUNTER — Other Ambulatory Visit: Payer: Self-pay | Admitting: Family Medicine

## 2023-04-16 ENCOUNTER — Ambulatory Visit: Payer: Federal, State, Local not specified - PPO | Admitting: Family Medicine

## 2023-04-16 DIAGNOSIS — E118 Type 2 diabetes mellitus with unspecified complications: Secondary | ICD-10-CM

## 2023-04-16 LAB — CMP14+EGFR
ALT: 21 [IU]/L (ref 0–44)
AST: 19 [IU]/L (ref 0–40)
Albumin: 4.7 g/dL (ref 3.9–4.9)
Alkaline Phosphatase: 94 [IU]/L (ref 44–121)
BUN/Creatinine Ratio: 9 — ABNORMAL LOW (ref 10–24)
BUN: 10 mg/dL (ref 8–27)
Bilirubin Total: 0.5 mg/dL (ref 0.0–1.2)
CO2: 21 mmol/L (ref 20–29)
Calcium: 9.6 mg/dL (ref 8.6–10.2)
Chloride: 105 mmol/L (ref 96–106)
Creatinine, Ser: 1.09 mg/dL (ref 0.76–1.27)
Globulin, Total: 2.1 g/dL (ref 1.5–4.5)
Glucose: 188 mg/dL — ABNORMAL HIGH (ref 70–99)
Potassium: 4.3 mmol/L (ref 3.5–5.2)
Sodium: 144 mmol/L (ref 134–144)
Total Protein: 6.8 g/dL (ref 6.0–8.5)
eGFR: 76 mL/min/{1.73_m2} (ref >59)

## 2023-04-16 LAB — LIPID PANEL
Chol/HDL Ratio: 5.1 ratio — ABNORMAL HIGH (ref 0.0–5.0)
Cholesterol, Total: 157 mg/dL (ref 100–199)
HDL: 31 mg/dL — ABNORMAL LOW (ref >39)
LDL Chol Calc (NIH): 92 mg/dL (ref 0–99)
Triglycerides: 199 mg/dL — ABNORMAL HIGH (ref 0–149)
VLDL Cholesterol Cal: 34 mg/dL (ref 5–40)

## 2023-04-16 LAB — CBC
Hematocrit: 55.8 % — ABNORMAL HIGH (ref 37.5–51.0)
Hemoglobin: 18.6 g/dL — ABNORMAL HIGH (ref 13.0–17.7)
MCH: 31.2 pg (ref 26.6–33.0)
MCHC: 33.3 g/dL (ref 31.5–35.7)
MCV: 94 fL (ref 79–97)
Platelets: 165 10*3/uL (ref 150–450)
RBC: 5.97 x10E6/uL — ABNORMAL HIGH (ref 4.14–5.80)
RDW: 12.9 % (ref 11.6–15.4)
WBC: 5.1 10*3/uL (ref 3.4–10.8)

## 2023-04-16 LAB — HEMOGLOBIN A1C
Est. average glucose Bld gHb Est-mCnc: 189 mg/dL
Hgb A1c MFr Bld: 8.2 % — ABNORMAL HIGH (ref 4.8–5.6)

## 2023-04-16 MED ORDER — TIRZEPATIDE 10 MG/0.5ML ~~LOC~~ SOAJ: 10.0000 mg | SUBCUTANEOUS | 0 refills | Status: DC

## 2023-04-16 NOTE — Addendum Note (Signed)
Addended by: Nani Gasser D on: 04/16/2023 10:18 AM   Modules accepted: Orders

## 2023-04-16 NOTE — Progress Notes (Signed)
Tom, glucose was elevated at 188.  Otherwise kidney and liver function are stable.  LDL is at goal.  Triglycerides are still up a little bit continue to work on healthy diet and regular exercise.  Your hemoglobin is really elevated.  If the hemoglobin is too high it can actually increase your risk for clotting.  I am not sure if this could be a lab error so I would like to recheck this again in about 2 to 3 weeks.  Make sure you are well-hydrated when you come in for that lab work.  Your A1c was 8.2 so pretty close to our machine which was 8.1.  So it definitely has increased.  I know we had talked about switching to the [facility] so would like to do that.  Just keep taking the Rybelsus until you are able to get the [facility] covered and you are able to pick it up.

## 2023-04-17 ENCOUNTER — Other Ambulatory Visit: Payer: Self-pay | Admitting: Family Medicine

## 2023-04-17 DIAGNOSIS — E118 Type 2 diabetes mellitus with unspecified complications: Secondary | ICD-10-CM

## 2023-04-19 MED ORDER — PRAVASTATIN SODIUM 20 MG PO TABS: ORAL_TABLET | ORAL | 3 refills | Status: DC

## 2023-05-05 ENCOUNTER — Encounter: Payer: Self-pay | Admitting: Family Medicine

## 2023-06-01 IMAGING — MR MR SHOULDER*L* W/O CM
5 series · 40 of 40 positions shown · non-contrast
Comparison: None.

CLINICAL DATA: Chronic shoulder pain.  Ongoing for 6 months.

EXAM:
MRI OF THE LEFT SHOULDER WITHOUT CONTRAST
TECHNIQUE: Multiplanar, multisequence MR imaging of the shoulder was performed.
No intravenous contrast was administered.

[Series 4: T2 fat-sat · axial · 4.0mm · 0.59mm/px · z∈[-106,+7]mm · 8 of 25 slices shown (1 of 3)]
[im 1/25]
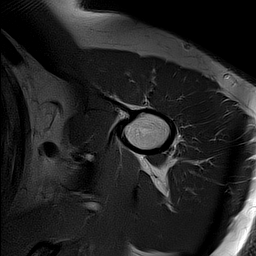
[im 4/25]
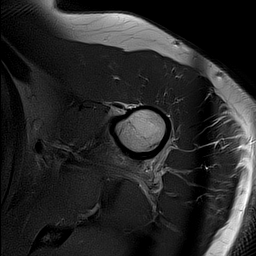
[im 7/25]
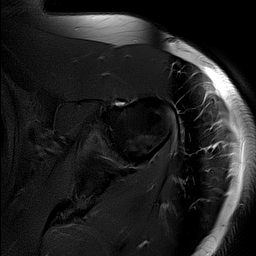
[im 11/25]
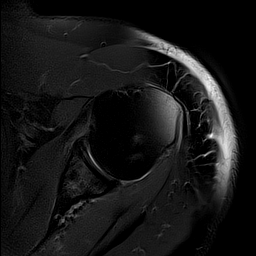
[im 14/25]
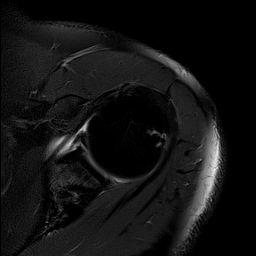
[im 18/25]
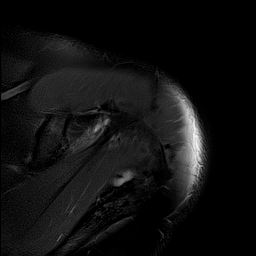
[im 21/25]
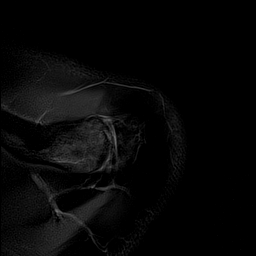
[im 25/25]
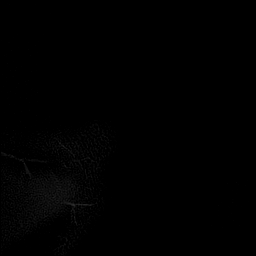

[Series 5: T2 fat-sat · oblique · 4.0mm · 0.62mm/px · 8 of 22 slices shown (2 of 3)]
[im 1/22]
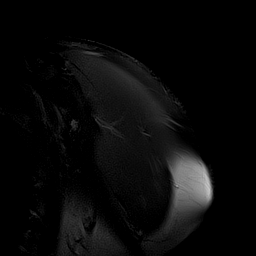
[im 4/22]
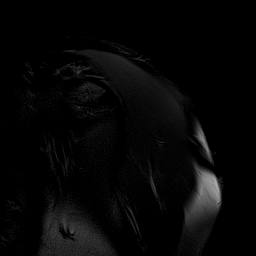
[im 7/22]
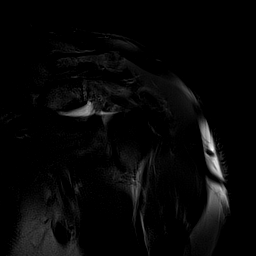
[im 10/22]
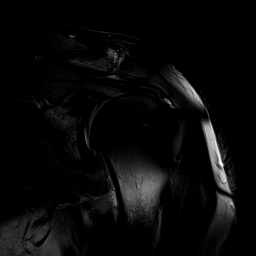
[im 13/22]
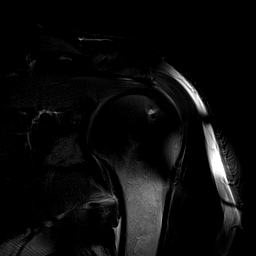
[im 16/22]
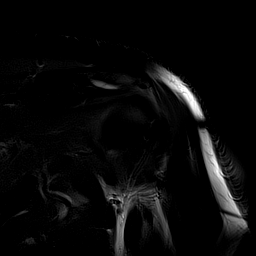
[im 19/22]
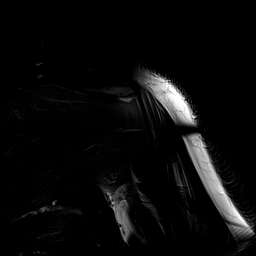
[im 22/22]
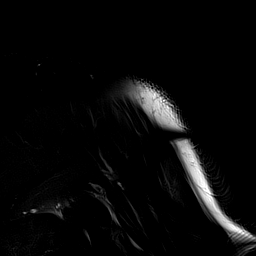

[Series 6: PD · oblique · 4.0mm · 0.62mm/px · 8 of 22 slices shown]
[im 1/22]
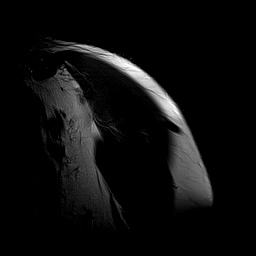
[im 4/22]
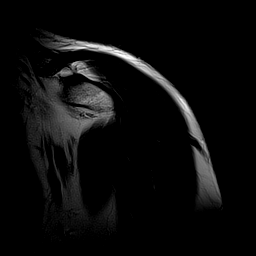
[im 7/22]
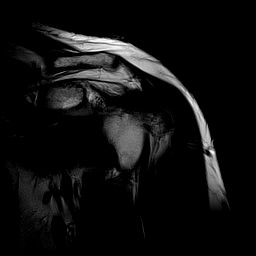
[im 10/22]
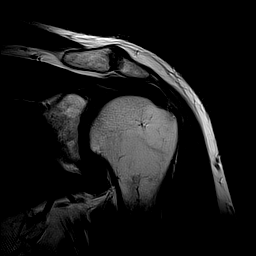
[im 13/22]
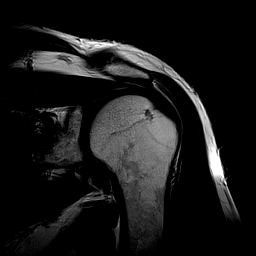
[im 16/22]
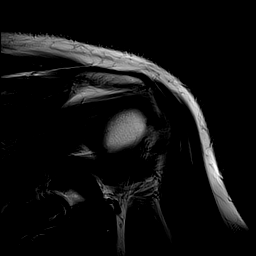
[im 19/22]
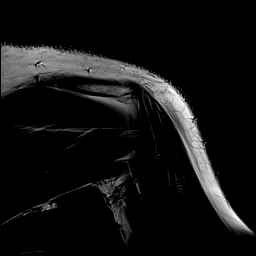
[im 22/22]
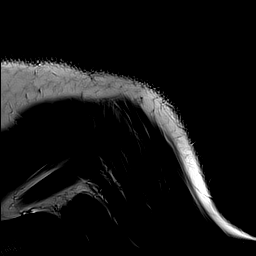

[Series 7: T1 · oblique · 4.0mm · 0.55mm/px · 8 of 22 slices shown]
[im 1/22]
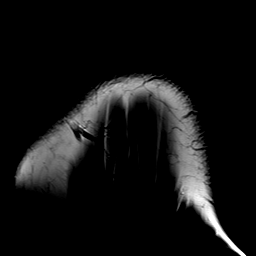
[im 4/22]
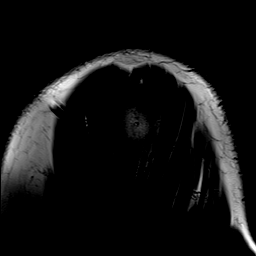
[im 7/22]
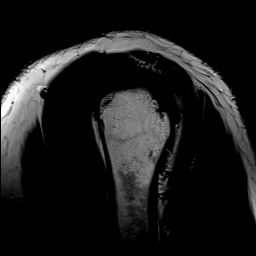
[im 10/22]
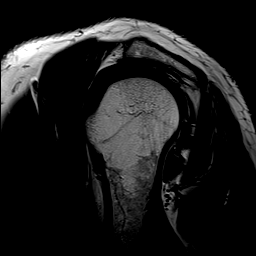
[im 13/22]
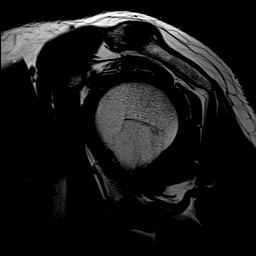
[im 16/22]
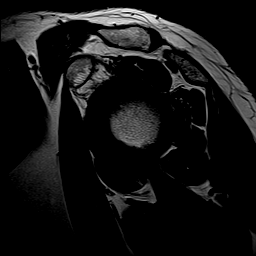
[im 19/22]
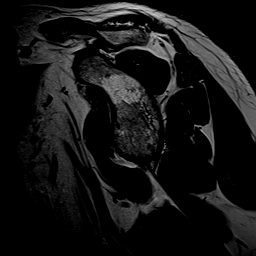
[im 22/22]
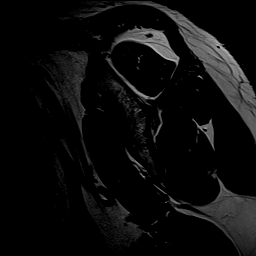

[Series 8: T2 fat-sat · oblique · 4.0mm · 0.55mm/px · 8 of 22 slices shown (3 of 3)]
[im 1/22]
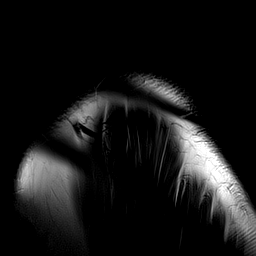
[im 4/22]
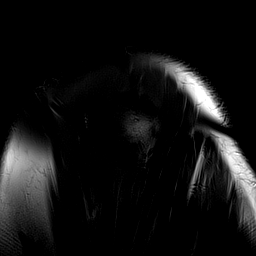
[im 7/22]
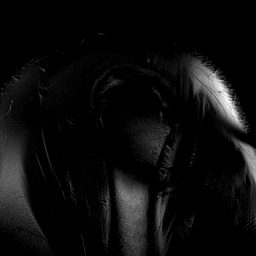
[im 10/22]
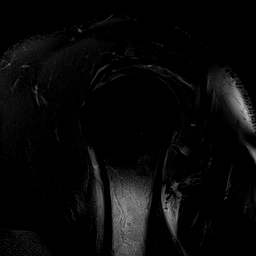
[im 13/22]
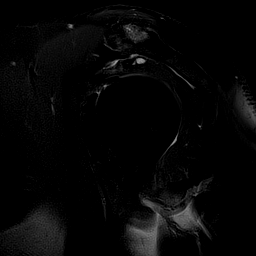
[im 16/22]
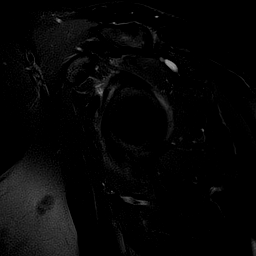
[im 19/22]
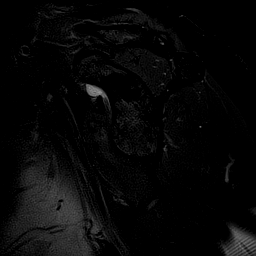
[im 22/22]
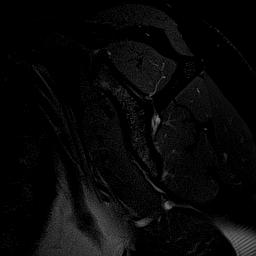

[40 of 40 positions shown; findings below may reference images not displayed]

FINDINGS: Rotator cuff: Moderate tendinosis of the supraspinatus tendon with a
small partial-thickness bursal surface tear anteriorly. Mild
tendinosis of the infraspinatus tendon. Teres minor tendon is
intact. Subscapularis tendon is intact.

Muscles: No muscle atrophy or edema. No intramuscular fluid
collection or hematoma.

Biceps Long Head: Mild tendinosis of the intra-articular portion of
the long head of the biceps tendon.

Acromioclavicular Joint: Moderate arthropathy of the
acromioclavicular joint. Trace subacromial/subdeltoid bursal fluid.

Glenohumeral Joint: No joint effusion. No chondral defect.

Labrum: Grossly intact, but evaluation is limited by lack of
intraarticular fluid/contrast.

Bones: No fracture or dislocation. No aggressive osseous lesion.

Other: No fluid collection or hematoma.
IMPRESSION: 1. Moderate tendinosis of the supraspinatus tendon with a small
partial-thickness bursal surface tear anteriorly.
2. Mild tendinosis of the infraspinatus tendon.
3. Mild tendinosis of the intra-articular portion of the long head
of the biceps tendon.

## 2023-06-02 ENCOUNTER — Other Ambulatory Visit: Payer: Self-pay | Admitting: *Deleted

## 2023-06-02 DIAGNOSIS — E118 Type 2 diabetes mellitus with unspecified complications: Secondary | ICD-10-CM

## 2023-06-02 NOTE — Telephone Encounter (Signed)
 error

## 2023-06-03 ENCOUNTER — Other Ambulatory Visit: Payer: Self-pay | Admitting: Family Medicine

## 2023-06-03 DIAGNOSIS — E118 Type 2 diabetes mellitus with unspecified complications: Secondary | ICD-10-CM

## 2023-06-04 NOTE — Telephone Encounter (Signed)
 Needs PA

## 2023-06-14 ENCOUNTER — Telehealth: Payer: Self-pay

## 2023-06-14 NOTE — Telephone Encounter (Signed)
Copied from CRM [id number]. Topic: Clinical - Prescription Issue >> Jun 14, 2023  1:55 PM Nila Nephew wrote: Reason for CRM: [employer]com calling in to request an update on the status of the [facility] PA sent in on 06/09/2022.

## 2023-06-16 ENCOUNTER — Other Ambulatory Visit: Payer: Self-pay | Admitting: Family Medicine

## 2023-06-16 DIAGNOSIS — E1129 Type 2 diabetes mellitus with other diabetic kidney complication: Secondary | ICD-10-CM

## 2023-06-18 ENCOUNTER — Encounter: Payer: Self-pay | Admitting: Family Medicine

## 2023-06-18 NOTE — Telephone Encounter (Signed)
Copied from CRM [id number]. Topic: Clinical - Prescription Issue >> Jun 18, 2023  9:22 AM Nila Nephew wrote: Reason for CRM: Patient is calling concerned because he is nearly out of diabetic medication. He is requesting either a PA be submitted ASAP for tirzepatide [facility]) 10 MG/0.5ML Pen or a refill of his current prescription Rybelsus. Patient will be completely out of medication Sunday. [facility] [address] per patient.

## 2023-07-09 ENCOUNTER — Other Ambulatory Visit: Payer: Self-pay | Admitting: Family Medicine

## 2023-07-09 DIAGNOSIS — E118 Type 2 diabetes mellitus with unspecified complications: Secondary | ICD-10-CM

## 2023-07-09 MED ORDER — TIRZEPATIDE 10 MG/0.5ML ~~LOC~~ SOAJ: 10.0000 mg | SUBCUTANEOUS | 0 refills | Status: DC

## 2023-07-09 NOTE — Telephone Encounter (Signed)
Copied from CRM [id number]. Topic: Clinical - Medication Refill >> Jul 09, 2023  9:08 AM Nila Nephew wrote: Most Recent Primary Care Visit:  Provider: Nani Gasser D  Department: [facility]  Visit Type: OFFICE VISIT  Date: 04/15/2023  Medication:  tirzepatide Gregg[facility] Keen) 10 MG/0.5ML Pen   Has the patient contacted their pharmacy? Yes - no refills left  Is this the correct pharmacy for this prescription? Yes  This is the patient's preferred pharmacy:  [id number] [place], [place] - [address] [place] [address] Phone: [phone] Fax: [phone]    Has the prescription been filled recently? No  Is the patient out of the medication? Yes - last one will be taken tomorrow  Has the patient been seen for an appointment in the last year OR does the patient have an upcoming appointment? Yes  Can we respond through MyChart? Yes  Agent: Please be advised that Rx refills may take up to 3 business days. We ask that you follow-up with your pharmacy.

## 2023-07-19 ENCOUNTER — Encounter: Payer: Self-pay | Admitting: Family Medicine

## 2023-07-19 ENCOUNTER — Ambulatory Visit: Payer: Federal, State, Local not specified - PPO | Admitting: Family Medicine

## 2023-07-19 VITALS — BP 126/83 | HR 82 | Ht 69.0 in | Wt 196.0 lb

## 2023-07-19 DIAGNOSIS — E1129 Type 2 diabetes mellitus with other diabetic kidney complication: Secondary | ICD-10-CM

## 2023-07-19 DIAGNOSIS — R809 Proteinuria, unspecified: Secondary | ICD-10-CM | POA: Diagnosis not present

## 2023-07-19 DIAGNOSIS — Z7984 Long term (current) use of oral hypoglycemic drugs: Secondary | ICD-10-CM

## 2023-07-19 DIAGNOSIS — E118 Type 2 diabetes mellitus with unspecified complications: Secondary | ICD-10-CM

## 2023-07-19 LAB — POCT GLYCOSYLATED HEMOGLOBIN (HGB A1C): Hemoglobin A1C: 7 % — AB (ref 4.0–5.6)

## 2023-07-19 MED ORDER — DEXCOM G7 RECEIVER DEVI: 99 refills | Status: DC

## 2023-07-19 MED ORDER — DEXCOM G7 SENSOR MISC: 99 refills | Status: DC

## 2023-07-19 MED ORDER — TIRZEPATIDE 10 MG/0.5ML ~~LOC~~ SOAJ: 10.0000 mg | SUBCUTANEOUS | 0 refills | Status: DC

## 2023-07-19 NOTE — Assessment & Plan Note (Signed)
 On low dose lisinopril for renal protection

## 2023-07-19 NOTE — Assessment & Plan Note (Signed)
 A1c much improved at 7.0 today he still continuing to work on making changes he is not exercising routinely yet but says he plans on [facility] [employer] next [facility] and getting more into routine with that.  He was able to have a CGM for about 10 days and said it really made a huge impact on really teaching him what was causing his sugars to bump up he really like to have it available to him again if at all possible.  He was able to get the monitor approved through [employer] but was still gena cost over $200 to refill it monthly.  He is doing well and Mounjaro 10 mg he has been on it for about 5 weeks thus far and has not had any major side effects.  To work on healthy diet and regular exercise and follow-up in 3 to 4 months.

## 2023-07-19 NOTE — Progress Notes (Signed)
   Established Patient Office Visit  Subjective  Patient ID: Benjamin Wright, male    DOB: 11/23/1960  Age: 63 y.o. MRN: [id number]  Chief Complaint  Patient presents with   Diabetes    HPI  Diabetes - no hypoglycemic events. No wounds or sores that are not healing well. No increased thirst or urination. [name]. Taking medications as prescribed without any side effects. Has lost some weight, about 10 lbs.    Goes to [employer] for eye exam.       ROS    Objective:     BP 126/83   Pulse 82   Ht 5\' 9"  (1.753 m)   Wt 196 lb (88.9 kg)   SpO2 97%   BMI 28.94 kg/m    Physical Exam Vitals and nursing note reviewed.  Constitutional:      Appearance: Normal appearance.  HENT:     Head: Normocephalic and atraumatic.  Eyes:     Conjunctiva/sclera: Conjunctivae normal.  Cardiovascular:     Rate and Rhythm: Normal rate and regular rhythm.  Pulmonary:     Effort: Pulmonary effort is normal.     Breath sounds: Normal breath sounds.  Skin:    General: Skin is warm and dry.  Neurological:     Mental Status: He is alert.  Psychiatric:        Mood and Affect: Mood normal.      Results for orders placed or performed in visit on 07/19/23  POCT HgB A1C  Result Value Ref Range   Hemoglobin A1C 7.0 (A) 4.0 - 5.6 %   HbA1c POC (<> result, manual entry)     HbA1c, POC (prediabetic range)     HbA1c, POC (controlled diabetic range)        The 10-year ASCVD risk score (Arnett DK, et al., 2019) is: 36.3%    Assessment & Plan:   Problem List Items Addressed This Visit       Endocrine   Microalbuminuria due to type 2 diabetes mellitus (HCC)   On low dose lisinopril for renal protection      Relevant Medications   tirzepatide (MOUNJARO) 10 MG/0.5ML Pen   Controlled diabetes mellitus type 2 with complications (HCC) - Primary   A1c much improved at 7.0 today he still continuing to work on making changes he is not exercising routinely yet but says he  plans on [facility] the [employer] next week and getting more into routine with that.  He was able to have a CGM for about 10 days and said it really made a huge impact on really teaching him what was causing his sugars to bump up he really like to have it available to him again if at all possible.  He was able to get the monitor approved through [employer] but was still gena cost over $200 to refill it monthly.  He is doing well and Mounjaro 10 mg he has been on it for about 5 weeks thus far and has not had any major side effects.  To work on healthy diet and regular exercise and follow-up in 3 to 4 months.      Relevant Medications   tirzepatide (MOUNJARO) 10 MG/0.5ML Pen   Continuous Glucose Sensor (DEXCOM G7 SENSOR) MISC   Continuous Glucose Receiver (DEXCOM G7 RECEIVER) DEVI   Other Relevant Orders   POCT HgB A1C (Completed)    Return in about 14 weeks (around 10/25/2023) for Diabetes follow-up.    Nani Gasser, MD

## 2023-07-20 ENCOUNTER — Encounter: Payer: Self-pay | Admitting: Family Medicine

## 2023-07-20 DIAGNOSIS — E118 Type 2 diabetes mellitus with unspecified complications: Secondary | ICD-10-CM

## 2023-07-20 NOTE — Telephone Encounter (Signed)
 Task completed by CMA Angela .  Mounjaro 10MG /0.5ML [place] [employer] has been approved. If you are changing the member's therapy the previously approved therapy will be canceled and replaced. The authorization is valid from 05/19/2023 through 06/17/2024.

## 2023-07-21 MED ORDER — DEXCOM G7 SENSOR MISC: 99 refills | Status: DC

## 2023-08-16 ENCOUNTER — Other Ambulatory Visit: Payer: Self-pay | Admitting: *Deleted

## 2023-08-16 DIAGNOSIS — E118 Type 2 diabetes mellitus with unspecified complications: Secondary | ICD-10-CM

## 2023-08-16 MED ORDER — TIRZEPATIDE 10 MG/0.5ML ~~LOC~~ SOAJ: 10.0000 mg | SUBCUTANEOUS | 0 refills | Status: DC

## 2023-08-30 NOTE — Telephone Encounter (Signed)
 Per letter in media tab - DEXCOM was approved from 05/20/23  - 06/17/24.

## 2023-09-14 ENCOUNTER — Other Ambulatory Visit: Payer: Self-pay | Admitting: Family Medicine

## 2023-09-14 DIAGNOSIS — E118 Type 2 diabetes mellitus with unspecified complications: Secondary | ICD-10-CM

## 2023-09-15 ENCOUNTER — Other Ambulatory Visit (HOSPITAL_COMMUNITY): Payer: Self-pay

## 2023-09-15 ENCOUNTER — Telehealth: Payer: Self-pay

## 2023-09-15 NOTE — Telephone Encounter (Signed)
 Pharmacy Patient Advocate Encounter   Received notification from Patient Pharmacy that prior authorization for Xigduo  is required/requested.   Insurance verification completed.   The patient is insured through [employer] .   Per test claim: Refill too soon. PA is not needed at this time. Medication was filled 07/10/23. Next eligible fill date is 09/16/23.

## 2023-09-22 NOTE — Telephone Encounter (Signed)
 This request has been handled. No further action is required. Please review other telephone encounters for additional information.

## 2023-09-24 ENCOUNTER — Encounter: Payer: Self-pay | Admitting: Family Medicine

## 2023-09-24 ENCOUNTER — Telehealth: Payer: Self-pay

## 2023-09-24 ENCOUNTER — Other Ambulatory Visit (HOSPITAL_COMMUNITY): Payer: Self-pay

## 2023-09-24 NOTE — Telephone Encounter (Signed)
 Pharmacy Patient Advocate Encounter   Received notification from Pt Calls Messages that prior authorization for Xigduo  [id number] is required/requested.   Insurance verification completed.   The patient is insured through [employer] .   Per test claim: The current 90 day co-pay is, $65.00.  No PA needed at this time. This test claim was processed through [facility]- copay amounts may vary at other pharmacies due to pharmacy/plan contracts, or as the patient moves through the different stages of their insurance plan.

## 2023-09-24 NOTE — Telephone Encounter (Signed)
 PA is required for Dapagliflozin . Thanks in advance.

## 2023-09-30 ENCOUNTER — Other Ambulatory Visit: Payer: Self-pay | Admitting: Family Medicine

## 2023-09-30 DIAGNOSIS — E1129 Type 2 diabetes mellitus with other diabetic kidney complication: Secondary | ICD-10-CM

## 2023-10-07 ENCOUNTER — Encounter: Payer: Self-pay | Admitting: Family Medicine

## 2023-10-07 DIAGNOSIS — Z7184 Encounter for health counseling related to travel: Secondary | ICD-10-CM

## 2023-10-07 MED ORDER — AZITHROMYCIN 250 MG PO TABS: ORAL_TABLET | ORAL | 0 refills | Status: AC

## 2023-10-22 ENCOUNTER — Encounter: Payer: Self-pay | Admitting: Family Medicine

## 2023-10-22 ENCOUNTER — Ambulatory Visit: Payer: Federal, State, Local not specified - PPO | Admitting: Family Medicine

## 2023-10-22 VITALS — BP 125/72 | HR 87 | Ht 69.0 in | Wt 185.0 lb

## 2023-10-22 DIAGNOSIS — E1129 Type 2 diabetes mellitus with other diabetic kidney complication: Secondary | ICD-10-CM

## 2023-10-22 DIAGNOSIS — E119 Type 2 diabetes mellitus without complications: Secondary | ICD-10-CM | POA: Diagnosis not present

## 2023-10-22 DIAGNOSIS — R809 Proteinuria, unspecified: Secondary | ICD-10-CM

## 2023-10-22 DIAGNOSIS — E118 Type 2 diabetes mellitus with unspecified complications: Secondary | ICD-10-CM | POA: Diagnosis not present

## 2023-10-22 DIAGNOSIS — Z125 Encounter for screening for malignant neoplasm of prostate: Secondary | ICD-10-CM

## 2023-10-22 DIAGNOSIS — Z7984 Long term (current) use of oral hypoglycemic drugs: Secondary | ICD-10-CM

## 2023-10-22 LAB — POCT GLYCOSYLATED HEMOGLOBIN (HGB A1C): Hemoglobin A1C: 6.1 % — AB (ref 4.0–5.6)

## 2023-10-22 NOTE — Assessment & Plan Note (Signed)
 Awaiting delivery of his lisinopril  but he is getting ready to go out of town.  He wants to know if would be okay to take it when he gets back since they have not delivered it yet since he is just on 2.5 mg daily.

## 2023-10-22 NOTE — Progress Notes (Signed)
   Established Patient Office Visit  Subjective  Patient ID: Benjamin Wright, male    DOB: 1961-03-19  Age: 63 y.o. MRN: [id number]  Chief Complaint  Patient presents with   Diabetes   Hypertension    HPI  Diabetes - no hypoglycemic events. No wounds or sores that are not healing well. No increased thirst or urination. [name]. Taking medications as prescribed without any side effects.  Currently on Mounjaro 10 mg.  He was unable to afford the CGM. Down about 9 lbs.  And doing well.       ROS    Objective:     BP 125/72   Pulse 87   Ht 5\' 9"  (1.753 m)   Wt 185 lb 0.6 oz (83.9 kg)   SpO2 97%   BMI 27.33 kg/m    Physical Exam Vitals and nursing note reviewed.  Constitutional:      Appearance: Normal appearance.  HENT:     Head: Normocephalic and atraumatic.  Eyes:     Conjunctiva/sclera: Conjunctivae normal.  Cardiovascular:     Rate and Rhythm: Normal rate and regular rhythm.  Pulmonary:     Effort: Pulmonary effort is normal.     Breath sounds: Normal breath sounds.  Skin:    General: Skin is warm and dry.  Neurological:     Mental Status: He is alert.  Psychiatric:        Mood and Affect: Mood normal.      Results for orders placed or performed in visit on 10/22/23  POCT HgB A1C  Result Value Ref Range   Hemoglobin A1C 6.1 (A) 4.0 - 5.6 %   HbA1c POC (<> result, manual entry)     HbA1c, POC (prediabetic range)     HbA1c, POC (controlled diabetic range)        The 10-year ASCVD risk score (Arnett DK, et al., 2019) is: 37.3%    Assessment & Plan:   Problem List Items Addressed This Visit       Endocrine   Microalbuminuria due to type 2 diabetes mellitus (HCC)   Awaiting delivery of his lisinopril  but he is getting ready to go out of town.  He wants to know if would be okay to take it when he gets back since they have not delivered it yet since he is just on 2.5 mg daily.      Relevant Orders   CMP14+EGFR   Urine  Microalbumin w/creat. ratio   PSA   Controlled diabetes mellitus type 2 with complications (HCC) - Primary   A1c looks fantastic, down to 6.1 from previous of 7.0.discussed goal to inc Mounjaro and dec the Xidguo when due for next refill. He just got 90 days supply about 2 weeks ago.   F/U in 4 mo       Relevant Orders   POCT HgB A1C (Completed)   CMP14+EGFR   Urine Microalbumin w/creat. ratio   PSA   Other Visit Diagnoses       Screening for malignant neoplasm of prostate       Relevant Orders   PSA       Return in about 4 months (around 02/14/2024) for Diabetes follow-up.    Duaine German, MD

## 2023-10-22 NOTE — Assessment & Plan Note (Addendum)
 A1c looks fantastic, down to 6.1 from previous of 7.0.discussed goal to inc Mounjaro and dec the Xidguo when due for next refill. He just got 90 days supply about 2 weeks ago.   F/U in 4 mo

## 2023-10-22 NOTE — Patient Instructions (Signed)
 Send me a MyChart note in early August and let me know when you are ready for us  to increase the Mounjaro and decrease the Xigduo .

## 2023-10-23 ENCOUNTER — Ambulatory Visit: Payer: Self-pay | Admitting: Family Medicine

## 2023-10-23 LAB — CMP14+EGFR
ALT: 16 IU/L (ref 0–44)
AST: 16 IU/L (ref 0–40)
Albumin: 4.6 g/dL (ref 3.9–4.9)
Alkaline Phosphatase: 89 IU/L (ref 44–121)
BUN/Creatinine Ratio: 22 (ref 10–24)
BUN: 24 mg/dL (ref 8–27)
Bilirubin Total: 0.3 mg/dL (ref 0.0–1.2)
CO2: 22 mmol/L (ref 20–29)
Calcium: 9.8 mg/dL (ref 8.6–10.2)
Chloride: 102 mmol/L (ref 96–106)
Creatinine, Ser: 1.09 mg/dL (ref 0.76–1.27)
Globulin, Total: 2 g/dL (ref 1.5–4.5)
Glucose: 102 mg/dL — ABNORMAL HIGH (ref 70–99)
Potassium: 4.2 mmol/L (ref 3.5–5.2)
Sodium: 140 mmol/L (ref 134–144)
Total Protein: 6.6 g/dL (ref 6.0–8.5)
eGFR: 75 mL/min/{1.73_m2} (ref >59)

## 2023-10-23 LAB — MICROALBUMIN / CREATININE URINE RATIO
Creatinine, Urine: 125 mg/dL
Microalb/Creat Ratio: 3 mg/g{creat} (ref 0–29)
Microalbumin, Urine: 4.3 ug/mL

## 2023-10-23 LAB — PSA: Prostate Specific Ag, Serum: 2.1 ng/mL (ref 0.0–4.0)

## 2023-10-23 NOTE — Progress Notes (Signed)
 HI Tom, your metabolic panel is OK. Prostate test is normal.  No excess protein in the urine.

## 2023-12-29 ENCOUNTER — Other Ambulatory Visit: Payer: Self-pay | Admitting: Family Medicine

## 2023-12-29 DIAGNOSIS — R809 Proteinuria, unspecified: Secondary | ICD-10-CM

## 2024-01-04 ENCOUNTER — Encounter: Payer: Self-pay | Admitting: Family Medicine

## 2024-01-04 DIAGNOSIS — E118 Type 2 diabetes mellitus with unspecified complications: Secondary | ICD-10-CM

## 2024-01-04 MED ORDER — DAPAGLIFLOZIN PRO-METFORMIN ER 5-500 MG PO TB24: 1.0000 | ORAL_TABLET | Freq: Every day | ORAL | 1 refills | Status: DC

## 2024-01-04 MED ORDER — DAPAGLIFLOZIN PRO-METFORMIN ER 5-500 MG PO TB24
1.0000 | ORAL_TABLET | Freq: Every day | ORAL | 1 refills | Status: DC
Start: 2024-01-04 — End: 2024-01-04

## 2024-01-04 MED ORDER — TIRZEPATIDE 12.5 MG/0.5ML ~~LOC~~ SOAJ: 12.5000 mg | SUBCUTANEOUS | 0 refills | Status: DC

## 2024-01-04 MED ORDER — TIRZEPATIDE 12.5 MG/0.5ML ~~LOC~~ SOAJ
12.5000 mg | SUBCUTANEOUS | 0 refills | Status: DC
Start: 2024-01-04 — End: 2024-04-14

## 2024-01-04 NOTE — Telephone Encounter (Signed)
 Called [facility] on [address] and cancelled prescriptions sent for mounjaro12.5mg  and dapagiflozin pro- metformin  ER . - left this as detailed message on pharmacy voice mail as they were closed for lunch.

## 2024-01-04 NOTE — Telephone Encounter (Signed)
 Please call [facility] and cancel mounjaro  and dapaglifozin.

## 2024-01-05 ENCOUNTER — Other Ambulatory Visit (HOSPITAL_COMMUNITY): Payer: Self-pay

## 2024-01-05 ENCOUNTER — Telehealth: Payer: Self-pay

## 2024-01-05 NOTE — Telephone Encounter (Signed)
 Pharmacy Patient Advocate Encounter  Received notification from [facility] that Prior Authorization for Xigdue XR 5-500mg  has been APPROVED from 01/05/24 to 01/04/25   PA #/Case ID/Reference #: [id number]

## 2024-01-05 NOTE — Telephone Encounter (Signed)
 Pharmacy Patient Advocate Encounter   Received notification from Patient Pharmacy that prior authorization for Xigduo  XR 5-500mg  tabs is required/requested.   Insurance verification completed.   The patient is insured through [facility] .   Per test claim: PA required; PA submitted to above mentioned insurance via Latent Key/confirmation #/EOC [id number] Status is pending

## 2024-01-18 ENCOUNTER — Other Ambulatory Visit: Payer: Self-pay | Admitting: Family Medicine

## 2024-01-18 DIAGNOSIS — E118 Type 2 diabetes mellitus with unspecified complications: Secondary | ICD-10-CM

## 2024-01-25 ENCOUNTER — Encounter: Payer: Self-pay | Admitting: Sports Medicine

## 2024-02-10 ENCOUNTER — Encounter: Payer: Self-pay | Admitting: Family Medicine

## 2024-02-10 ENCOUNTER — Ambulatory Visit: Admitting: Family Medicine

## 2024-02-10 VITALS — BP 111/76 | HR 83 | Ht 69.0 in | Wt 173.0 lb

## 2024-02-10 DIAGNOSIS — M545 Low back pain, unspecified: Secondary | ICD-10-CM

## 2024-02-10 DIAGNOSIS — Z7984 Long term (current) use of oral hypoglycemic drugs: Secondary | ICD-10-CM

## 2024-02-10 DIAGNOSIS — Z23 Encounter for immunization: Secondary | ICD-10-CM

## 2024-02-10 DIAGNOSIS — Z7184 Encounter for health counseling related to travel: Secondary | ICD-10-CM | POA: Diagnosis not present

## 2024-02-10 DIAGNOSIS — E118 Type 2 diabetes mellitus with unspecified complications: Secondary | ICD-10-CM | POA: Diagnosis not present

## 2024-02-10 LAB — POCT GLYCOSYLATED HEMOGLOBIN (HGB A1C): HbA1c, POC (controlled diabetic range): 5.8 % (ref 0.0–7.0)

## 2024-02-10 MED ORDER — AZITHROMYCIN 250 MG PO TABS: 500.0000 mg | ORAL_TABLET | Freq: Every day | ORAL | 0 refills | Status: AC

## 2024-02-10 NOTE — Progress Notes (Signed)
 Established Patient Office Visit  Subjective  Patient ID: Benjamin Wright, male    DOB: 08/18/1960  Age: 63 y.o. MRN: [id number]  Chief Complaint  Patient presents with   Diabetes   Hypertension    HPI Discussed the use of AI scribe software for clinical note transcription with the patient, who gave verbal consent to proceed.  History of Present Illness Benjamin Wright is a 63 year old male with diabetes who presents for a follow-up on his blood sugar management.  Glycemic control - Diabetes mellitus with ongoing blood glucose monitoring two to three times daily, typically in the morning and before or after meals - Recent blood glucose readings in the low to mid 120s, with a recent reading of 111 - Previously, blood glucose levels were consistently under 120 - No hypoglycemic events reported - Current medications include Mounjaro  12.5 mg (increased from 10 mg two weeks ago) and Farxiga  combined with metformin  (metformin  dose reduced from 1000 mg to 500 mg) - No chest pain or shortness of breath - Has sufficient testing supplies, which are provided at no cost  Musculoskeletal pain - Low back discomfort present - Plans to receive a massage during upcoming vacation for symptomatic relief  Preventive care and immunizations - Due for an eye examination; last visit was with Dr. Thedford Pan - Typically tolerates influenza vaccinations well, but recalls one prior episode of feeling unwell after a flu shot     ROS    Objective:     BP 111/76   Pulse 83   Ht 5' 9 (1.753 m)   Wt 173 lb (78.5 kg)   SpO2 97%   BMI 25.55 kg/m    Physical Exam Vitals and nursing note reviewed.  Constitutional:      Appearance: Normal appearance.  HENT:     Head: Normocephalic and atraumatic.  Eyes:     Conjunctiva/sclera: Conjunctivae normal.  Cardiovascular:     Rate and Rhythm: Normal rate and regular rhythm.  Pulmonary:     Effort: Pulmonary effort is normal.      Breath sounds: Normal breath sounds.  Skin:    General: Skin is warm and dry.  Neurological:     Mental Status: He is alert.  Psychiatric:        Mood and Affect: Mood normal.      Results for orders placed or performed in visit on 02/10/24  POCT HgB A1C  Result Value Ref Range   Hemoglobin A1C     HbA1c POC (<> result, manual entry)     HbA1c, POC (prediabetic range)     HbA1c, POC (controlled diabetic range) 5.8 0.0 - 7.0 %      The 10-year ASCVD risk score (Arnett DK, et al., 2019) is: 31.4%    Assessment & Plan:   Problem List Items Addressed This Visit       Endocrine   Controlled diabetes mellitus type 2 with complications (HCC) - Primary   Relevant Orders   POCT HgB A1C (Completed)   Other Visit Diagnoses       Immunization due       Relevant Orders   Flu vaccine HIGH DOSE PF(Fluzone Trivalent) (Completed)     Travel advice encounter       Relevant Medications   azithromycin  (ZITHROMAX ) 250 MG tablet     Midline low back pain without sciatica, unspecified chronicity          Assessment and Plan Assessment & Plan Type  2 diabetes mellitus Diabetes well-controlled with current regimen. Blood glucose slightly elevated but acceptable. A1c improved from 7.0 to 6.1. Awaiting current A1c results. - Continue Mounjaro  12.5 mg.,  Just recently transition to this dose. - Continue Farxiga  with metformin . - A1c looks great today at 5.8.  Continue current regimen.  We did discuss that we might adjust his pill at next office visit if he is still doing really well. - Consider reducing or stopping Farxiga  with metformin  if A1c remains controlled. - Ensure adequate supply of testing supplies.   Travelers advice- He is going to [place] for vacation and he would like to have a prescription for azithromycin  for traveler's diarrhea.  He has used it before. Encouraged precautions to avoid traveler's diarrhea.  Low back pain - Planning on getting a massage. - He can let  us  know if pain continues.  Return in about 4 months (around 06/11/2024) for Diabetes follow-up.    Benjamin Byars, MD

## 2024-03-11 ENCOUNTER — Other Ambulatory Visit: Payer: Self-pay | Admitting: Family Medicine

## 2024-03-11 DIAGNOSIS — E1129 Type 2 diabetes mellitus with other diabetic kidney complication: Secondary | ICD-10-CM

## 2024-04-13 ENCOUNTER — Other Ambulatory Visit: Payer: Self-pay | Admitting: Family Medicine

## 2024-04-13 DIAGNOSIS — E118 Type 2 diabetes mellitus with unspecified complications: Secondary | ICD-10-CM

## 2024-04-21 ENCOUNTER — Telehealth: Payer: Self-pay | Admitting: Pharmacy Technician

## 2024-04-21 ENCOUNTER — Other Ambulatory Visit (HOSPITAL_COMMUNITY): Payer: Self-pay

## 2024-04-21 NOTE — Telephone Encounter (Signed)
 Pharmacy Patient Advocate Encounter   Received notification from Onbase that prior authorization for Mounjaro  12.5MG /0.5ML auto-injectors is due for renewal.   Insurance verification completed.   The patient is insured through [employer].  Action: PA required; PA submitted to above mentioned insurance via Latent Key/confirmation #/EOC [id number] Status is pending

## 2024-04-21 NOTE — Telephone Encounter (Signed)
 Pharmacy Patient Advocate Encounter  Received notification from [facility] that Prior Authorization for Mounjaro  12.5MG /0.5ML auto-injectors has been APPROVED from 04/21/2024 to 04/21/2025.   PA #/Case ID/Reference #: [id number]

## 2024-04-27 ENCOUNTER — Other Ambulatory Visit: Payer: Self-pay

## 2024-04-28 ENCOUNTER — Telehealth: Payer: Self-pay | Admitting: *Deleted

## 2024-04-28 DIAGNOSIS — E1169 Type 2 diabetes mellitus with other specified complication: Secondary | ICD-10-CM

## 2024-04-28 DIAGNOSIS — E118 Type 2 diabetes mellitus with unspecified complications: Secondary | ICD-10-CM

## 2024-04-28 NOTE — Telephone Encounter (Signed)
 LVM advising pt that his insurance is no longer covering Xigduo  ER. They will cover Synjardy or Xigduo  XR.

## 2024-05-01 ENCOUNTER — Other Ambulatory Visit (HOSPITAL_COMMUNITY): Payer: Self-pay

## 2024-05-01 ENCOUNTER — Telehealth: Payer: Self-pay

## 2024-05-01 MED ORDER — SYNJARDY 12.5-500 MG PO TABS: 1.0000 | ORAL_TABLET | Freq: Two times a day (BID) | ORAL | 0 refills | Status: AC

## 2024-05-01 NOTE — Telephone Encounter (Signed)
 Pharmacy Patient Advocate Encounter   Received notification from CoverMyMeds that prior authorization for Synjardy   is required/requested.   Insurance verification completed.   The patient is insured through [facility].   Per test claim: PA required; PA submitted to above mentioned insurance via Latent Key/confirmation #/EOC [id number] Status is pending

## 2024-05-01 NOTE — Telephone Encounter (Signed)
 Meds changed  Routing to prior auth team. See note below

## 2024-05-02 NOTE — Telephone Encounter (Signed)
 Pharmacy Patient Advocate Encounter  Received notification from [facility] that Prior Authorization for Synjardy  12.5-500mg  tabs has been APPROVED from 05/02/24 to 05/01/25   PA #/Case ID/Reference #: [id number]

## 2024-05-05 ENCOUNTER — Other Ambulatory Visit (HOSPITAL_COMMUNITY): Payer: Self-pay

## 2024-05-09 ENCOUNTER — Other Ambulatory Visit: Payer: Self-pay

## 2024-06-07 ENCOUNTER — Ambulatory Visit: Admitting: Family Medicine

## 2024-06-16 ENCOUNTER — Other Ambulatory Visit: Payer: Self-pay | Admitting: Family Medicine

## 2024-06-16 DIAGNOSIS — E118 Type 2 diabetes mellitus with unspecified complications: Secondary | ICD-10-CM

## 2024-06-20 ENCOUNTER — Other Ambulatory Visit: Payer: Self-pay | Admitting: Family Medicine

## 2024-06-20 DIAGNOSIS — E118 Type 2 diabetes mellitus with unspecified complications: Secondary | ICD-10-CM

## 2024-06-20 NOTE — Telephone Encounter (Signed)
 Pls call pt: we have Synjardy  on med list not Xigduo .  Maybe a pharmacy error?

## 2024-06-20 NOTE — Telephone Encounter (Signed)
 Request received for refill - XIGDUO  XR 5-500 MG TB24    The original prescription was discontinued on 05/01/2024 by Alvan Dorothyann BIRCH, MD for the following reason: Change in therapy. Renewing this prescription may not be appropriate.   Last written 01/04/2024 Last OV 02/10/2024 OV notes : Type 2 diabetes mellitus Diabetes well-controlled with current regimen. Blood glucose slightly elevated but acceptable. A1c improved from 7.0 to 6.1. Awaiting current A1c results. - Continue Mounjaro  12.5 mg.,  Just recently transition to this dose. - Continue Farxiga  with metformin . - A1c looks great today at 5.8.  Continue current regimen.  We did discuss that we might adjust his pill at next office visit if he is still doing really well. - Consider reducing or stopping Farxiga  with metformin  if A1c remains controlled. - Ensure adequate supply of testing supplies.  Synjardy  shows prescribed on 05/01/2024  Upcoming appt 07/03/2024

## 2024-07-03 ENCOUNTER — Ambulatory Visit: Admitting: Family Medicine
